# Patient Record
Sex: Male | Born: 1935 | Race: White | Hispanic: No | Marital: Married | Smoking: Never smoker
Health system: Southern US, Community
[De-identification: ages and names within clinical notes are randomized; demographics above are authoritative.]

## PROBLEM LIST (undated history)

## (undated) DIAGNOSIS — I714 Abdominal aortic aneurysm, without rupture, unspecified: Secondary | ICD-10-CM

## (undated) DIAGNOSIS — E119 Type 2 diabetes mellitus without complications: Secondary | ICD-10-CM

## (undated) DIAGNOSIS — I10 Essential (primary) hypertension: Secondary | ICD-10-CM

## (undated) HISTORY — DX: Abdominal aortic aneurysm, without rupture: I71.4

## (undated) HISTORY — DX: Abdominal aortic aneurysm, without rupture, unspecified: I71.40

## (undated) HISTORY — PX: HERNIA REPAIR: SHX51

## (undated) HISTORY — DX: Essential (primary) hypertension: I10

## (undated) HISTORY — DX: Type 2 diabetes mellitus without complications: E11.9

---

## 1998-05-21 ENCOUNTER — Ambulatory Visit (HOSPITAL_BASED_OUTPATIENT_CLINIC_OR_DEPARTMENT_OTHER): Admission: RE | Admit: 1998-05-21 | Discharge: 1998-05-21 | Payer: Self-pay | Admitting: Diagnostic Radiology

## 2002-09-10 ENCOUNTER — Encounter: Payer: Self-pay | Admitting: Oncology

## 2002-09-10 ENCOUNTER — Ambulatory Visit (HOSPITAL_COMMUNITY): Admission: RE | Admit: 2002-09-10 | Discharge: 2002-09-10 | Payer: Self-pay | Admitting: Oncology

## 2002-10-07 ENCOUNTER — Ambulatory Visit: Admission: RE | Admit: 2002-10-07 | Discharge: 2002-10-07 | Payer: Self-pay | Admitting: Oncology

## 2002-12-17 ENCOUNTER — Encounter: Payer: Self-pay | Admitting: Family Medicine

## 2002-12-17 ENCOUNTER — Encounter: Admission: RE | Admit: 2002-12-17 | Discharge: 2002-12-17 | Payer: Self-pay | Admitting: Family Medicine

## 2003-01-20 ENCOUNTER — Inpatient Hospital Stay (HOSPITAL_COMMUNITY): Admission: RE | Admit: 2003-01-20 | Discharge: 2003-01-30 | Payer: Self-pay | Admitting: Orthopedic Surgery

## 2003-01-22 ENCOUNTER — Encounter: Payer: Self-pay | Admitting: Orthopedic Surgery

## 2005-06-27 ENCOUNTER — Encounter (HOSPITAL_COMMUNITY): Admission: RE | Admit: 2005-06-27 | Discharge: 2005-09-25 | Payer: Self-pay | Admitting: Family Medicine

## 2006-08-29 ENCOUNTER — Ambulatory Visit: Payer: Self-pay | Admitting: Gastroenterology

## 2006-08-30 ENCOUNTER — Ambulatory Visit: Payer: Self-pay | Admitting: Gastroenterology

## 2006-10-03 ENCOUNTER — Ambulatory Visit: Payer: Self-pay | Admitting: Gastroenterology

## 2007-10-22 ENCOUNTER — Encounter (HOSPITAL_COMMUNITY): Admission: RE | Admit: 2007-10-22 | Discharge: 2008-01-20 | Payer: Self-pay | Admitting: Family Medicine

## 2010-10-29 NOTE — Discharge Summary (Signed)
NAME:  Ethan Walters, Ethan Walters                         ACCOUNT NO.:  [id number]   MEDICAL RECORD NO.:  [id number]                   PATIENT TYPE:  INP   LOCATION:  5012                                 FACILITY:  [facility]   PHYSICIAN:  John L. Rendall, M.D.               DATE OF BIRTH:  04-18-1934   DATE OF ADMISSION:  01/20/2003  DATE OF DISCHARGE:  01/30/2003                                 DISCHARGE SUMMARY   ADMISSION DIAGNOSES:  1. End-stage osteoarthritis, bone on bone, left knee.  2. Hypertension.  3. Hypercholesterolemia.  4. Erythrocytosis.   DISCHARGE DIAGNOSES:  1. Status post left total knee arthroplasty.  2. Acute blood loss anemia secondary to surgery.  3. Gastrointestinal bleed due to gastritis.  4. Deep vein thrombosis, left peroneal vein.  5. Hypertension.  6. Hypercholesterolemia.  7. Erythrocytosis.   HISTORY OF PRESENT ILLNESS:  Ms. Vanloan is a 77 year old white male with a  history of twisting and dislocating his knee approximately 40 years ago. At  that time, he was treated with immobilization. Since that time, he has had  chronic twisting out of joint sensation and about 20 years ago and locked  out of place. The patient eventually underwent a medial meniscectomy  by Dr.  Fannie Knee. Since that time, he has had chronic nagging, dull aching pain with  significant swelling with the sensation that the knee would dislocated out  of joint at any time. He also has associated swelling of the left knee that  required the use of crutches in terms for him to ambulate. Mechanical  symptoms include popping, clicking, and grinding. Currently, he uses a cane  to ambulate. X-rays of the left knee show bone on bone in the medial  compartment of the left knee.   ALLERGIES:  No known drug allergies.   CURRENT MEDICATIONS:  1. Lisinopril 20 mg p.o. q.d.  2. Lipitor 20 mg p.o. q.d.   SURGICAL PROCEDURE:  On January 20, 2003, the patient was taken to operating  room by Dr. Erasmo Leventhal  assisted by Rexene Edison, P.A.-C. The patient was  placed under general anesthesia, and a left total knee replacement was  performed. Total operative time was approximately 1 hour and 15 minutes. The  patient tolerated the procedure well and returned to recovery in good and  stable condition.   On January 20, 2003, the patient had an esophagogastroduodenoscopy performed  by Dr. Dorena Cookey. Evidence of an active recent upper GI bleed but no source  seen and no active bleeding was noted at the time of the procedure. The  patient tolerated the procedure fairly well, and there were no immediate  complications.   CONSULTANTS:  The following consults were obtained while the patient was  hospitalized:  PT, OT, medicine, gastroenterology, and rehab.   HOSPITAL COURSE:  On postoperative day #2, the patient was found to be  vomiting black emesis with a  recurrent feeling of nausea. He was found to  have acute gastritis and therefore was taken off of his Arixtra.  Subsequently, he developed a DVT in the mid posterior calf involving the  peroneal vein. This is a very short segment involved in the mild calf. He  also developed acute blood loss anemia secondary to anemia and required 2  units of packed red blood cells postoperatively. Left proximal femur  abrasion and left buttocks pressure ulcer also developed. These were treated  by the skin care team.   LABORATORY DATA:  Routine labs on admission:  White blood cell count 7.8,  hemoglobin 17.9, hematocrit 51.8, platelets 244.   Coags on admission:  PT 13.4, INR 1.0, PTT 31.   Routine chemistries on admission:  Sodium 140, potassium 5.1, chloride 106,  bicarb 29, BUN 19, creatinine 1.1, glucose 94.   Routine hepatic enzymes were AST of 24, ALT 26, ALP 88, total bilirubin 0.8.   Urinalysis on admission was negative.   Helicobacter pylori antibody on January 23, 2003 ISR 0.5.   EKG dated January 24, 2003 showed normal sinus rhythm with occasional  PVC.  Heart rate 75 beats per minute. PR interval 148 milliseconds. PRT axis 63, -  25, 37.   EKG dated January 27, 2003 showed normal sinus rhythm with frequent premature  ventricular complexes. Heart rate 74 beats per minute. PR interval 152  milliseconds. PRT axis 63, -17, 55.   Chest x-ray dated January 22, 2003 showed cardiomegaly with central vascular  congestion. Two-view abdomen on January 22, 2003 showed no evidence of bowel  obstruction.   Lower extremity venous Doppler exam showed no evidence of DVT, superficial  thrombosis, or Baker's cyst on January 23, 2003. Lower extremity venous  Doppler exam dated January 25, 2003 showed isolated mid posterior calf DVT  involving the peroneal vein. Short segmented DVT mid to upper calf.   DISCHARGE INSTRUCTIONS:  The patient was to resume medications. Add the  following:  Percocet 5 mg one to two p.o. every four to six hours as needed  for pain.   ACTIVITY:  As tolerated with walker. Home health/PT with Genevieve Norlander.   DIET:  No restrictions.   WOUND CARE:  Daily dressing changes. Keep wound clean and dry. Call Dr.  Sable Feil office if temperature greater than 101.5, chills, swelling, foul-  smelling drainage, or pain not controlled with pain medications.   SPECIAL INSTRUCTIONS:  CPM 0 to 90 degrees six to eight hours a day,  increased by 10 degrees daily.   FOLLOW UP:  The patient is to follow up with Dr. Priscille Kluver in approximately  five days in office. The patient is to call the office at [phone] for  appointment.   Follow up with primary care physician in one to two weeks due to upper GI,  acute blood loss anemia, hypertension, and DVT in left leg.   CONDITION ON DISCHARGE:  The patient was discharged home in good and stable  condition.      Richardean Canal, P.A.                       John L. Priscille Kluver, M.D.    Leanora Cover  D:  02/18/2003  T:  02/18/2003  Job:  [id number]

## 2010-10-29 NOTE — Op Note (Signed)
NAME:  Ethan Walters, Ethan Walters                         ACCOUNT NO.:  [id number]   MEDICAL RECORD NO.:  [id number]                   PATIENT TYPE:  INP   LOCATION:  5012                                 FACILITY:  [facility]   PHYSICIAN:  John C. Madilyn Fireman, M.D.                 DATE OF BIRTH:  24-Nov-1933   DATE OF PROCEDURE:  01/20/2003  DATE OF DISCHARGE:                                 OPERATIVE REPORT   l   PROCEDURE PERFORMED:  Esophagogastroduodenoscopy.   INDICATIONS FOR THE PROCEDURE:  Upper GI bleeding.   PROCEDURE:  The patient was placed in the left lateral decubitus position  and placed on the pulse monitor with continuous low-flow oxygen delivered  via nasal cannula.  He was sedated with 30 mcg of IV fentanyl and 3 mg of IV  Versed.  The Olympus video endoscope was advanced under direct vision into  the oropharynx and esophagus.  The esophagus was straight and of normal  caliber at the squamocolumnar line at 38 cm. There was dark mahogany  material refluxing up to the esophagus, and this was difficult to control  throughout the procedure, even with the head of the bed at maximum  elevation.  There was no visible bleeding from the esophagus, although  inspection was less than optimal due to continued regurgitation of gastric  contents, requiring both endoscopic and external suctioning.  The stomach  was entered, and there was a fairly large body of mahogany-colored fluid  with a lot of particulate matter that made suctioning difficult.  I could  not suction all of it adequately to inspect the entire fundus and dependent  portions of the body.  There were also some black flecks of coffee ground  material scattered on the walls of the stomach.  However, a significant  portion of the body and some of the fundus was inspected, and no bleeding  sources were seen.  The antrum likewise appeared normal.  No ulcer or active  bleeding was seen.  The pylorus was not deformed, and easily allowed  the  passage of the endoscope into the duodenum.  Both the bulb and second  portion were well-inspected with the exception of a patch of erythema in the  bulb of the duodenum.  No abnormalities were found.  No bright red blood or  clot was seen anywhere in the stomach, duodenum, or esophagus.  The scope  was then withdrawn, and the patient returned to the recovery room in stable  condition.  He tolerated the procedure fairly well, and there were no  immediate complications.   IMPRESSION:  Evidence of active recent upper GI bleeding with no source seen  and no active bleeding noted at the time of the procedure.   PLAN:  We will continue to treat with proton pump inhibitor.  Continue NG  suction.  Transfuse as necessary.  Check H. pylori antibody.  Treat for  eradication of Helicobacter if positive.                                               John C. Madilyn Fireman, M.D.    JCH/MEDQ  D:  01/22/2003  T:  01/23/2003  Job:  [id number]

## 2010-10-29 NOTE — H&P (Signed)
NAME:  Ethan Walters, Ethan Walters                         ACCOUNT NO.:  [id number]   MEDICAL RECORD NO.:  [id number]                   PATIENT TYPE:  INP   LOCATION:  NA                                   FACILITY:  [facility]   PHYSICIAN:  John L. Rendall, M.D.               DATE OF BIRTH:  01-Nov-1933   DATE OF ADMISSION:  01/20/2003  DATE OF DISCHARGE:                                HISTORY & PHYSICAL   CHIEF COMPLAINT:  Painful left knee approximately 40 years.   HISTORY OF PRESENT ILLNESS:  The patient is a 77 year old white male with  history of twisting and dislocating his knee approximately 40 years ago. He  was treated with immobilization. He indicates he has had since that time  chronic twisting out of joint sensation, but about 20 years ago, it locked  out of place. He was evaluated by Dr. Fannie Knee who performed a medial  meniscectomy, open. Since that time, the patient has been having chronic  nagging dull aching pain with significant swelling any time he had a  sensation like the knee would dislocated out of joint. He would be able to  self-reduce. He would have chronic swelling for periods of time which he  would have to alter his ambulating status with use of crutches. He does have  chronic mechanical symptoms like popping and clicking and grinding. He is  currently using a cane to ambulate.   X-rays show bone on bone medial compartment, left knee.   ALLERGIES:  No known drug allergies.   CURRENT MEDICATIONS:  1. Lisinopril 20 mg p.o. q.d.  2. Lipitor 20 mg p.o. q.d.   PAST MEDICAL HISTORY:  1. Hypertension followed by Dr. Penni Bombard.  2. Hypercholesterolemia.  3. Erythrocytosis, currently being followed and monitored by Dr. Arline Asp.   PAST SURGICAL HISTORY:  Left knee open meniscectomy by Dr. Fannie Knee in 1984.   The patient denies any complications of the above mentioned surgical  procedure.   SOCIAL HISTORY:  The patient is a healthy-appearing, well-developed, white  male 77 years  of age. He does have a 46 year of a pack and a half smoking  history, but he did stop on May 15 of this year and has not smoked since.  The patient denies any alcohol use. He is married. He does have two grown  children. He currently lives in a two-story house with seven steps to main  entrance with living facilities on the second floor.   FAMILY PHYSICIAN:  Dr. Penni Bombard at [phone].   HEMATOLOGIST:  Dr. Arline Asp.   FAMILY HISTORY:  Mother is deceased from old age. Father is deceased from  complications of the flu and pneumonia. The patient has got two brothers,  oldest brother is in good health; younger brother has had multiple CABG and  recently diagnosed with diabetes with insulin treatment.   REVIEW OF SYSTEMS:  Positive for glasses only, left knee  pain. Other than  that, he is negative for any complaints in the general, sensory,  respiratory, cardiac, GI, GU, musculoskeletal, hematologic, neurological, or  mental status issues not mentioned above.   PHYSICAL EXAMINATION:  VITAL SIGNS:  Height is 5 foot 11, weight 214 pounds.  Pulse is 62 and slightly irregular with palpable premature beats.  Respirations are 16. Blood pressure is 162/78. The patient is afebrile.  HEENT:  Head was normocephalic, atraumatic, nontender maxilla and frontal  sinuses. The patient is wearing glasses. Pupils are equal and round and  reactive and accommodating to light. Extraocular movements intact. Sclerae  is nonicteric. External ears without deformities. Canals patent. TMs pearly  gray intact. Gross hearing is intact. Nasal septum was midline. Oral buccal  mucosa was pink and moist without lesions. Dentation was in fair repair. The  patient was able to swallow without any difficulty.  NECK:  Supple. No palpable lymphadenopathy. Thyroid region was nontender.  The patient had full range of motion of his cervical spine and no tenderness  along the entire spinal column with percussion.  EXTREMITIES:  Upper  extremities were symmetrically size and shaped. He had  full range of motion of his shoulders, elbows, and wrists without any  discomfort. Motor strength was 5/5. Lower extremities:  Right and left hip  had full range of motion with full extension, flexion up to 120 degrees, 20  degrees internal and external rotation without any discomfort or mechanical  symptoms. The left knee had a slight amount of soft tissue swelling about  it. No palpable fusion. No sign of erythema or ecchymosis. He had a midline  curved well healed incision over the joint. He had about 5 degrees short of  full extension, flexion back to about 90 degrees. He had slight medial to  lateral laxity but no instability. He was tender along the medial joint  line, nontender laterally. He significant crepitus on the patella with range  of motion. Calf was nontender. Right knee was without any signs of erythema  or ecchymosis. No palpable effusions. Nontender medial lateral joint line.  He had full extension and flexion back to 115 degrees. No medial, lateral,  or AP instability. Slight crepitus on the patella. Calf was nontender.  Ankles were symmetrical with good dorsi and plantar flexion.  LUNGS:  Clear and equal bilaterally. No wheezes, rales, rhonchi, or rubs  noted.  HEART:  Regular rate and rhythm with an occasional extra palpable beat. No  murmurs, rubs, or gallops  noted.  ABDOMEN:  Soft, nontender. Bowel sounds were normal active throughout. No  hepatosplenomegaly. The CVA region was nontender.  PERIPHERAL VASCULAR:  Carotid pulses were 2+, no bruits. Radial pulses were  2+. Dorsalis pedis and posterior tibial pulses were 1+. He had no lower  extremity edema. He did have some varicosities throughout the lower calves  bilaterally. No pigmentation changes.  NEUROLOGICAL:  The patient was conscious, alert, and appropriate. Held an easy conversation with the examiner. Cranial nerves II-XII were grossly  intact. The  patient was grossly intact to light touch and sensation from  head to toe. Deep tendon reflexes of the upper and lower extremities were  symmetrical right to left. He had no gross neurological defects noted.  BREASTS/RECTAL/GENITOURINARY EXAMS:  Deferred at this time.   IMPRESSION:  1. End-stage osteoarthritis, bone on bone medial joint left knee.  2. Hypertension.  3. Hypercholesterolemia.  4. Erythrocytosis.    PLAN:  The patient has been evaluated by Dr. Penni Bombard and  approved for  surgery with no significant increase in risk. He did have a cardiac stress  test performed which was normal. The patient will undergo all routine labs  and tests prior to having a left total knee arthroplasty by Dr. Priscille Kluver at  [facility] on January 20, 2003.      Jamelle Rushing, P.A.                      John L. Priscille Kluver, M.D.    RWK/MEDQ  D:  01/14/2003  T:  01/14/2003  Job:  [id number]

## 2010-10-29 NOTE — Consult Note (Signed)
NAME:  Ethan Walters, Ethan Walters                         ACCOUNT NO.:  [id number]   MEDICAL RECORD NO.:  [id number]                   PATIENT TYPE:  INP   LOCATION:  5012                                 FACILITY:  [facility]   PHYSICIAN:  Alfonse Spruce, M.D.               DATE OF BIRTH:  December 17, 1933   DATE OF CONSULTATION:  01/22/2003  DATE OF DISCHARGE:                                   CONSULTATION   REASON FOR CONSULTATION:  Sudden change in the leukocytosis as well as  increased BUN and creatinine overnight associated with nausea and vomiting,  recurrent.   HISTORY OF PRESENT ILLNESS:  The patient, how is a 77 year old white male,  admitted to the hospital under care of Dr. Priscille Kluver where he had surgical  intervention for the left knee total knee replacement because of end-stage  osteoarthritis which was done on January 20, 2003.  The patient had a history  of hypertension and hyperlipidemia.  The hypercholesterolemia and  erythrocytosis monitored by Lenard Galloway. Chaney Malling, M.D.  His attending PCP  physician, Rodolph Bong, M.D., [phone].   PAST MEDICAL HISTORY:  Open meniscectomy by Paul Dykes. Fannie Knee, M.D. in 1994.   SOCIAL HISTORY:  He has quit smoking and he had history of 46 years of  smoking, quit 15 years ago. He has no alcohol abuse. He has two children.   FAMILY HISTORY:  Noncontributory.   REVIEW OF SYSTEMS:  He had erythrocytosis treated by Samul Dada,  M.D., who is oncologist.  The patient found that he was vomiting black  hematemesis with recurrence with feeling of nausea continuously being  present in spite of recurrent vomiting.  He had no significant abdominal  pain.  The patient currently on prophylaxis anticoagulant and he had a  recent history of diabetes mellitus.  No history of MI.   CURRENT MEDICATIONS:  1. Lisinopril 20 mg daily.  2. Lipitor 20 mg daily.  3. Oxycodone 10 mg b.i.d.  4. Robaxin 500 mg p.o. q.6h.   PHYSICAL EXAMINATION:  GENERAL: The patient is  conscious, alert, and  oriented.  He has been feeling ill and sick on the stomach mainly, however,  no shortness of breath.  VITAL SIGNS:  He had a low grade temperature with temperature of 99.2, blood  pressure was decreased today to 90/44, however, he had history of  hypertension, oxygen desaturation of 94, and pulse was 91.  His laboratory  each morning indicating white count 14.9 and it was 10.4 yesterday.  His  hemoglobin 9.8 and hematocrit 27.6, and yesterday his hemoglobin was 12.5  and hematocrit 35.7.  His BUN was 25 and creatinine 1.8 on August 10.  However on August 11, today, his BUN went to 47.  Sodium 145, potassium 4.9,  and chloride 102, carbon dioxide 27, and blood sugar was 204.  HEENT:  Normocephalic and atraumatic.  Pupils equal, round, and reactive to  light.  Oropharynx was normal.  He had evidence of hematemesis, black  vomitus.  NECK:  Supple with no JVD, no thyromegaly, and no lymphadenopathy.  CHEST:  Clear to auscultation and percussion and no wheezes or rhonchi.  HEART:  PMI at the fifth intercostal space. Normal S1 and S2.  He had  occasional PVC's.  Asymptomatic.  ABDOMEN:  Only tenderness was deep palpation.  Chest x-ray; cardiomegaly and  rest was in good condition.  However, clinically he is not. He has normal  small bowel.  He has pattern and no free air. However, the patient has been  recurrent vomiting and even in front of me with no comfort.  EXTREMITIES:  He had left knee arthroplasty with 1+ edema, expected after  surgery. The right lower extremity; no tenderness on the calf.  The patient  also is on prophylactic Arixtra subcutaneous 2.5 mg every 24 hours  anticoagulant.   IMPRESSION:  1. Underlying acute gastritis with nausea and vomiting recurrent,     possibility of retro effect of the anticoagulant, however, other etiology     could not be clear.  2. The BUN elevation could be from the blood in the abdomen, underlying     hypotension also  will effect the renal function.  Underlying history of     hypertension currently is hypotensive.  3. Status post left knee arthroplasty.   PLAN:  We will start with NG tube and the gastric contents for hemoccult.  Hemoglobin and hematocrit stat as well as start IV fluids with D5.9 normal  saline 100 mL an hour with the CBG q.6h to be covered with Humulin Regular  Insulin to scale. Start him on Reglan 10 mg IV piggyback q.6h and then again  if still continued nausea and vomiting on a p.r.n. basis.   Further investigation will depend on the patient's debility and if he is  positive for hemoccult, the gastric content for occult blood, probability to  withhold the prophylaxis for the fondaparinux Arixtra.  For prophylaxis  for DVT.                                               Alfonse Spruce, M.D.    Wynn Maudlin  D:  01/22/2003  T:  01/23/2003  Job:  [id number]   cc:   Rodolph Bong, M.D.  [address] [place], [place] [address]  Fax: [phone]   Carlisle Beers. Rendall III, M.D.  [address] [place]  [place] [address]  Fax: [phone]

## 2010-10-29 NOTE — Assessment & Plan Note (Signed)
[facility]                         GASTROENTEROLOGY OFFICE NOTE   FOUNT, BAHE                      MRN:          [id number]  DATE:10/03/2006                            DOB:          07-28-1933    Ethan Walters underwent colonoscopy on August 30, 2006 because of rectal  bleeding and was found to have acute sigmoid diverticulitis.  He was  treated appropriately with metronidazole and Cipro and is completely  asymptomatic at this point.  Endoscopy at the same time showed a large  duodenal ulcer that was felt secondary to NSAID use.  He was taken off  of aspirin and NSAIDs and placed on Prevacid 30 mg a day.  He denies any  upper GI complaints at this time.  His appetite is good and his weight  is stable.  He is followed by Dr. Artis Flock.   Additionally he takes:  1. Lipitor 20 mg daily.  2. Lisinopril/hydrochlorothiazide daily.  3  Prevacid daily.   He weighs 214 pounds and blood pressure is 160/88.  Pulse was 72 and  regular  General physical exam was not repeated.   RECOMMENDATIONS:  1. High fiber diet with daily Benefiber, incresed  p.o. fluids.  2. Patient education movie diverticulitis management.  3. Another month of Prevacid and then I think he can discontinue this      medication if he is not on aspirin or NSAIDs.  His scheduled biopsy      for Helicobacter pylori was negative.  4. Continue medical followup with Dr. Artis Flock.     Vania Rea. Jarold Motto, MD, Caleen Essex, FAGA  Electronically Signed    DRP/MedQ  DD: 10/03/2006  DT: 10/03/2006  Job #: [id number]   cc:   Quita Skye. Artis Flock, M.D.

## 2010-10-29 NOTE — Op Note (Signed)
NAME:  Ethan Walters, Ethan Walters                         ACCOUNT NO.:  [id number]   MEDICAL RECORD NO.:  [id number]                   PATIENT TYPE:  INP   LOCATION:  2550                                 FACILITY:  [facility]   PHYSICIAN:  John L. Rendall III, M.D.           DATE OF BIRTH:  October 09, 1933   DATE OF PROCEDURE:  01/20/2003  DATE OF DISCHARGE:                                 OPERATIVE REPORT   PREOPERATIVE DIAGNOSIS:  End-stage osteoarthritis, left knee.   POSTOPERATIVE DIAGNOSIS:  End-stage osteoarthritis, left knee.   OPERATION/PROCEDURE:  Left LCS total knee replacement.   SURGEON:  John L. Rendall, M.D.   ASSISTANT:  Madilyn Fireman, P.A.-C.   ANESTHESIA:  General.   PATHOLOGY:  The patient is bone against bone medially.  He has unremitting  pain and conservative measures no longer work.   DESCRIPTION OF PROCEDURE:  Under general anesthesia the left leg was  prepared with Betadine and draped as a sterile field with a sterile  tourniquet on the proximal thigh.  A midline incision is made after wrapping  up the leg with an Esmarch and using the tourniquet at 350 mmHg.  Dissection  is carried down through skin and subcutaneous tissue.  Patella is everted.  Femur is sized at a large using the first tibial guide, a proximal tibial  resection is carried out using the first femoral guide, intercondylar drill  hole is placed using the second guide, the interim posterior flare of the  distal femur are resected with a 10 mm flexion gap.  The intramedullary  guide is then used and distal femoral cut is made.  It is remade until  flexion and extension gaps balance which took two more cuts. At this point,  tourniquet spontaneously came down.  The leg was elevated, rewrapped and the  tourniquet reworked.  As it was a sterile tourniquet, it was possible to  just tighten it up and redo it.  The cause of its failure was never  determined.  The case then continued, recessing guide was used and the  spreader was inserted.  Remnants of cruciates and menisci were resected.  The __________ were inserted to give a size #5.  Center peg hole with fins  was applied.  Trial reduction of a #5 tibia, pin bearing and large femur  revealed excellent fit and alignment.  Patella was osteotomized with three  peg holes placed.  Trial reduction of the patella with all the other  components reveal excellent fit, alignment and stability.  Permanent  components were then obtained.  Bony surfaces were prepared with pulse  irrigation.  Permanent components were then cemented in place.  Once cement  hardened, excess was removed and the tourniquet was let down.  Multiple  small vessels were cauterized.  The knee was then closed in layers with #1  Ticron, 0- and 2-0 Vicryl and skin clips.  Total operative time about 1 hour  and 15 minutes.  The patient tolerated the procedure well and returned to  recovery in good condition.                                                John L. Dorothyann Gibbs, M.D.    Renato Gails  D:  01/20/2003  T:  01/20/2003  Job:  [id number]

## 2010-10-29 NOTE — Assessment & Plan Note (Signed)
[facility]                         GASTROENTEROLOGY OFFICE NOTE   NAME:MORRISFrederik, Standley                      MRN:          [id number]  DATE:08/29/2006                            DOB:          01/15/1934    Mr. Amer is a 77 year old white male referred on emergent basis by Dr.  Artis Flock for evaluation of abdominal pain and rectal bleeding.   HISTORY OF PRESENT ILLNESS:  Mr. Haithcock has been in good health except  for hypercholesterolemia and hypertension, until approximately 3 days  ago when he developed crampy, suprapubic pain followed by initially some  clots of blood and associated tenesmus. His stools have gotten lighter  in color but his abdominal pain has continued. He denies nausea, or  vomiting, hematemesis, or any upper GI, or hepatobiliary complaints. He  also denies abuse of NSAIDs but does take the aspirin 81 mg a day for  prophylactic purposes.   The patient has a history of black stools and I previously saw him and  performed endoscopy in November of 2004. At that time it was felt that  he most likely had a healed NSAID induced ulcer. He had a total knee  replacement done in August of 2004 and developed some postop bleeding  and had a negative endoscopy performed by Dr. Dorena Cookey. The patient  never had any specific upper GI complaints that I am aware of, although  his endoscopy report of November 2004 relates that he did have some  epigastric pain and a history of hematemesis.   The patient at that time was empirically placed on Prevacid and did well  and was not seen again until this time. He did have colonoscopy as  screening procedure 10 years ago that was unremarkable, except for  numerous wide mouthed left colon diverticula. At that time he was placed  on Metamucil and a high fiber diet.   Patient denies again the use of NSAIDs or other salicylates.  His  appetite is good and his weight is stable. He denies any palpitations,  or shortness of breath, or chest pain. Saw Dr. Artis Flock in his office today  and had appropriate laboratory test ordered which are not available at  this time. He has had no major change in his diet and denies any  specific food intolerances.   PAST MEDICAL HISTORY:  Otherwise unremarkable except for hypertension,  hypercholesterolemia. He has had a knee replacement done by Dr. Priscille Kluver.  He was seen by Dr. Diona Browner in 2004 and had a normal exercise adenosine  cardiac scan but he did have an inferior apical defect.   MEDICATIONS:  1. Unknown blood pressure pill.  2. Unknown cholesterol medication.  3. Aspirin 81 mg a day.   He has had some edema with neosporin used locally.   FAMILY HISTORY:  Noncontributory.   SOCIAL HISTORY:  He is married and lives with his wife. He has 12th  grade education. He currently is retired. He denies the use of alcohol  or cigarettes.   REVIEW OF SYSTEMS:  Noncontributory without any cardiovascular,  pulmonary, genitourinary, neurologic, orthopedic, or endocrine problems,  or psychiatric difficulties.   PHYSICAL EXAMINATION:  Shows him to be a health-appearing white male in  no acute distress, appearing his stated age. He is 5 feet 11 inches tall  and weighs 211 pounds. Blood pressure is 142/76 and pulse was 72 and  regular. I could not appreciate stigmata of chronic liver disease.  CHEST: Clear and he appeared to be in regular rhythm without murmurs,  gallops, or rubs.  There was no hepatosplenomegaly, abdominal masses, or significant  tenderness. Bowel sounds were normal.  RECTAL: Unremarkable as was his rectal exam. There was hard normal  colored stool in the rectal vault that was guaiac positive.  PERIPHERAL EXTREMITIES: Unremarkable without edema or phlebitis.  MENTAL STATUS: Clear.   ASSESSMENT:  I think Mr. Plantz most likely has a lower gastrointestinal  bleed, perhaps diverticulosis which is resolving at this time. He does  have lower  abdominal suprapubic pain and tenesmus. However, I am  concerned that he may have underlying colon carcinoma. There has been  questions in the past as to possible recurrent NSAID induced ulcer  disease although I think this is unlikely.   RECOMMENDATIONS:  1. We will try to get blood work done this morning at Dr. Blair Heys      office.  2. We will have the patient stay on clear liquids and do colonoscopy      tomorrow after bowel preparation. He may need endoscopic exam      additionally.  3. Discontinue salicylate use.  4. Empirically place him on Prevacid until procedures can be      completed.     Vania Rea. Jarold Motto, MD, Caleen Essex, FAGA  Electronically Signed    DRP/MedQ  DD: 08/29/2006  DT: 08/29/2006  Job #: [id number]   cc:   Quita Skye. Artis Flock, M.D.

## 2011-03-09 LAB — CBC
HCT: 44.2
Hemoglobin: 15.8
MCHC: 35.8
MCV: 100.7 — ABNORMAL HIGH
Platelets: 244
RBC: 4.39
RDW: 12.6
WBC: 6.8

## 2015-06-26 DIAGNOSIS — R69 Illness, unspecified: Secondary | ICD-10-CM | POA: Diagnosis not present

## 2015-07-29 DIAGNOSIS — H34831 Tributary (branch) retinal vein occlusion, right eye, with macular edema: Secondary | ICD-10-CM | POA: Diagnosis not present

## 2015-07-29 DIAGNOSIS — H35351 Cystoid macular degeneration, right eye: Secondary | ICD-10-CM | POA: Diagnosis not present

## 2015-07-29 DIAGNOSIS — H353131 Nonexudative age-related macular degeneration, bilateral, early dry stage: Secondary | ICD-10-CM | POA: Diagnosis not present

## 2015-08-06 DIAGNOSIS — K420 Umbilical hernia with obstruction, without gangrene: Secondary | ICD-10-CM | POA: Diagnosis not present

## 2015-08-06 DIAGNOSIS — R1084 Generalized abdominal pain: Secondary | ICD-10-CM | POA: Diagnosis not present

## 2015-08-06 DIAGNOSIS — R1909 Other intra-abdominal and pelvic swelling, mass and lump: Secondary | ICD-10-CM | POA: Diagnosis not present

## 2015-08-06 DIAGNOSIS — M25552 Pain in left hip: Secondary | ICD-10-CM | POA: Diagnosis not present

## 2015-08-06 DIAGNOSIS — K439 Ventral hernia without obstruction or gangrene: Secondary | ICD-10-CM | POA: Diagnosis not present

## 2015-08-13 DIAGNOSIS — K429 Umbilical hernia without obstruction or gangrene: Secondary | ICD-10-CM | POA: Diagnosis not present

## 2015-08-13 DIAGNOSIS — K4090 Unilateral inguinal hernia, without obstruction or gangrene, not specified as recurrent: Secondary | ICD-10-CM | POA: Insufficient documentation

## 2015-08-13 DIAGNOSIS — M6208 Separation of muscle (nontraumatic), other site: Secondary | ICD-10-CM | POA: Diagnosis not present

## 2015-08-20 DIAGNOSIS — I10 Essential (primary) hypertension: Secondary | ICD-10-CM | POA: Diagnosis not present

## 2015-08-20 DIAGNOSIS — Z01818 Encounter for other preprocedural examination: Secondary | ICD-10-CM | POA: Diagnosis not present

## 2015-08-21 DIAGNOSIS — D176 Benign lipomatous neoplasm of spermatic cord: Secondary | ICD-10-CM | POA: Diagnosis not present

## 2015-08-21 DIAGNOSIS — N189 Chronic kidney disease, unspecified: Secondary | ICD-10-CM | POA: Diagnosis not present

## 2015-08-21 DIAGNOSIS — Z79899 Other long term (current) drug therapy: Secondary | ICD-10-CM | POA: Diagnosis not present

## 2015-08-21 DIAGNOSIS — Z7984 Long term (current) use of oral hypoglycemic drugs: Secondary | ICD-10-CM | POA: Diagnosis not present

## 2015-08-21 DIAGNOSIS — I129 Hypertensive chronic kidney disease with stage 1 through stage 4 chronic kidney disease, or unspecified chronic kidney disease: Secondary | ICD-10-CM | POA: Diagnosis not present

## 2015-08-21 DIAGNOSIS — K4090 Unilateral inguinal hernia, without obstruction or gangrene, not specified as recurrent: Secondary | ICD-10-CM | POA: Diagnosis not present

## 2015-08-21 DIAGNOSIS — E1122 Type 2 diabetes mellitus with diabetic chronic kidney disease: Secondary | ICD-10-CM | POA: Diagnosis not present

## 2015-08-21 DIAGNOSIS — Z7982 Long term (current) use of aspirin: Secondary | ICD-10-CM | POA: Diagnosis not present

## 2015-08-21 DIAGNOSIS — K429 Umbilical hernia without obstruction or gangrene: Secondary | ICD-10-CM | POA: Diagnosis not present

## 2015-08-21 DIAGNOSIS — E119 Type 2 diabetes mellitus without complications: Secondary | ICD-10-CM | POA: Diagnosis not present

## 2015-09-03 DIAGNOSIS — Z09 Encounter for follow-up examination after completed treatment for conditions other than malignant neoplasm: Secondary | ICD-10-CM | POA: Insufficient documentation

## 2015-09-03 DIAGNOSIS — R69 Illness, unspecified: Secondary | ICD-10-CM | POA: Diagnosis not present

## 2015-09-16 DIAGNOSIS — R69 Illness, unspecified: Secondary | ICD-10-CM | POA: Diagnosis not present

## 2015-09-16 DIAGNOSIS — K353 Acute appendicitis with localized peritonitis: Secondary | ICD-10-CM | POA: Diagnosis not present

## 2015-09-16 DIAGNOSIS — E119 Type 2 diabetes mellitus without complications: Secondary | ICD-10-CM | POA: Diagnosis not present

## 2015-09-16 DIAGNOSIS — Z7982 Long term (current) use of aspirin: Secondary | ICD-10-CM | POA: Diagnosis not present

## 2015-09-16 DIAGNOSIS — I739 Peripheral vascular disease, unspecified: Secondary | ICD-10-CM | POA: Diagnosis not present

## 2015-09-16 DIAGNOSIS — M1990 Unspecified osteoarthritis, unspecified site: Secondary | ICD-10-CM | POA: Diagnosis not present

## 2015-09-16 DIAGNOSIS — K3580 Unspecified acute appendicitis: Secondary | ICD-10-CM | POA: Diagnosis not present

## 2015-09-16 DIAGNOSIS — I10 Essential (primary) hypertension: Secondary | ICD-10-CM | POA: Diagnosis not present

## 2015-09-17 DIAGNOSIS — I444 Left anterior fascicular block: Secondary | ICD-10-CM | POA: Diagnosis not present

## 2015-09-17 DIAGNOSIS — K3580 Unspecified acute appendicitis: Secondary | ICD-10-CM | POA: Diagnosis not present

## 2015-09-17 HISTORY — PX: APPENDECTOMY: SHX54

## 2015-10-02 DIAGNOSIS — H34831 Tributary (branch) retinal vein occlusion, right eye, with macular edema: Secondary | ICD-10-CM | POA: Diagnosis not present

## 2015-10-02 DIAGNOSIS — H353131 Nonexudative age-related macular degeneration, bilateral, early dry stage: Secondary | ICD-10-CM | POA: Diagnosis not present

## 2015-10-05 DIAGNOSIS — R05 Cough: Secondary | ICD-10-CM | POA: Diagnosis not present

## 2015-10-05 DIAGNOSIS — J209 Acute bronchitis, unspecified: Secondary | ICD-10-CM | POA: Diagnosis not present

## 2015-10-05 DIAGNOSIS — J22 Unspecified acute lower respiratory infection: Secondary | ICD-10-CM | POA: Diagnosis not present

## 2015-10-08 DIAGNOSIS — Z8719 Personal history of other diseases of the digestive system: Secondary | ICD-10-CM | POA: Insufficient documentation

## 2015-10-08 DIAGNOSIS — I714 Abdominal aortic aneurysm, without rupture, unspecified: Secondary | ICD-10-CM | POA: Insufficient documentation

## 2015-10-08 DIAGNOSIS — Z9889 Other specified postprocedural states: Secondary | ICD-10-CM

## 2015-10-09 ENCOUNTER — Encounter: Payer: Self-pay | Admitting: Vascular Surgery

## 2015-10-15 ENCOUNTER — Encounter: Payer: Self-pay | Admitting: Vascular Surgery

## 2015-10-16 ENCOUNTER — Encounter: Payer: Self-pay | Admitting: Vascular Surgery

## 2015-10-20 ENCOUNTER — Encounter: Payer: Self-pay | Admitting: Vascular Surgery

## 2015-10-23 ENCOUNTER — Encounter: Payer: Self-pay | Admitting: Vascular Surgery

## 2015-10-29 ENCOUNTER — Encounter: Payer: Self-pay | Admitting: Vascular Surgery

## 2015-10-29 ENCOUNTER — Ambulatory Visit (INDEPENDENT_AMBULATORY_CARE_PROVIDER_SITE_OTHER): Payer: Medicare HMO | Admitting: Vascular Surgery

## 2015-10-29 VITALS — BP 139/71 | HR 81 | Temp 97.5°F | Resp 16 | Ht 68.0 in | Wt 198.0 lb

## 2015-10-29 DIAGNOSIS — I714 Abdominal aortic aneurysm, without rupture, unspecified: Secondary | ICD-10-CM

## 2015-10-29 NOTE — Progress Notes (Signed)
Referred by: Lurlean Horns, MD No address on file  Reason for referral: AAA  History of Present Illness  The patient is a 82 y.o. (1933-09-13) male who presents with chief complaint: "aneurysm."  This patient recently had a CT abd/pelvis at outside hospital on 09/17/15.  This CT demonstrated: acute appendicitis and incidentally found AAA, reportedly 5.1 cm x 4.1 cm.  The patient does not have back or abdominal pain.  The patient notes never history of embolic episodes from the AAA.  The patient's risk factors for AAA included: prior smoking.  Protective factors include: DM.  The patient does not smoke cigarettes.  Past Medical History  Diagnosis Date  . Hypertension   . Diabetes mellitus without complication ([place])   . AAA (abdominal aortic aneurysm) [facility])     Past Surgical History  Procedure Laterality Date  . Hernia repair    . Appendectomy  09/17/2015    Social History   Social History  . Marital Status: Married    Spouse Name: N/A  . Number of Children: N/A  . Years of Education: N/A   Occupational History  . Not on file.   Social History Main Topics  . Smoking status: Never Smoker   . Smokeless tobacco: Never Used  . Alcohol Use: No  . Drug Use: No  . Sexual Activity: Not on file   Other Topics Concern  . Not on file   Social History Narrative    History reviewed. No pertinent family history.  Current Outpatient Prescriptions  Medication Sig Dispense Refill  . albuterol (PROAIR HFA) 108 (90 Base) MCG/ACT inhaler     . aspirin EC 81 MG tablet Take by mouth.    . Cholecalciferol (VITAMIN D3) 2000 units capsule Take by mouth.    Marland Kitchen glucose blood (ONE TOUCH ULTRA TEST) test strip     . Liraglutide (VICTOZA) 18 MG/3ML SOPN     . lisinopril-hydrochlorothiazide (PRINZIDE,ZESTORETIC) 20-12.5 MG tablet     . simvastatin (ZOCOR) 40 MG tablet Take 40 mg by mouth.    . vitamin C (ASCORBIC ACID) 500 MG tablet Take 500 mg by mouth.     No current  facility-administered medications for this visit.     Allergies  Allergen Reactions  . Neosporin [Neomycin-Bacitracin Zn-Polymyx] Swelling     REVIEW OF SYSTEMS:  (Positives checked otherwise negative)  CARDIOVASCULAR:   [ ]  chest pain,  [ ]  chest pressure,  [ ]  palpitations,  [ ]  shortness of breath when laying flat,  [ ]  shortness of breath with exertion,   [ ]  pain in feet when walking,  [ ]  pain in feet when laying flat, [ ]  history of blood clot in veins (DVT),  [ ]  history of phlebitis,  [ ]  swelling in legs,  [ ]  varicose veins  PULMONARY:   [ ]  productive cough,  [ ]  asthma,  [ ]  wheezing  NEUROLOGIC:   [ ]  weakness in arms or legs,  [ ]  numbness in arms or legs,  [ ]  difficulty speaking or slurred speech,  [ ]  temporary loss of vision in one eye,  [ ]  dizziness  HEMATOLOGIC:   [ ]  bleeding problems,  [ ]  problems with blood clotting too easily  MUSCULOSKEL:   [ ]  joint pain, [ ]  joint swelling  GASTROINTEST:   [ ]  vomiting blood,  [ ]  blood in stool     GENITOURINARY:   [ ]  burning with urination,  [ ]  blood in  urine  PSYCHIATRIC:   [ ]  history of major depression  INTEGUMENTARY:   [ ]  rashes,  [ ]  ulcers  CONSTITUTIONAL:   [ ]  fever,  [ ]  chills   For VQI Use Only  PRE-ADM LIVING: Home  AMB STATUS: Ambulatory  CAD Sx: None  PRIOR CHF: None  STRESS TEST: [x]  No, [ ]  Normal, [ ]  + ischemia, [ ]  + MI, [ ]  Both   Physical Examination  Filed Vitals:   10/29/15 1341  BP: 139/71  Pulse: 81  Temp: 97.5 F (36.4 C)  Resp: 16  Height: 5\' 8"  (1.727 m)  Weight: 198 lb (89.812 kg)  SpO2: 94%   Body mass index is 30.11 kg/(m^2).  General: A&O x 3, WDWN  Head: /AT  Ear/Nose/Throat: Hearing grossly intact, nares w/o erythema or drainage, oropharynx w/o Erythema/Exudate  Eyes: PERRLA, EOMI  Neck: Supple, no nuchal rigidity, no palpable LAD  Pulmonary: Sym exp, good air movt, CTAB, no rales, rhonchi, &  wheezing  Cardiac: RRR, Nl S1, S2, no Murmurs, rubs or gallops  Vascular: Vessel Right Left  Radial Palpable Palpable  Brachial Palpable Palpable  Carotid Palpable, without bruit Palpable, without bruit  Aorta Not palpable N/A  Femoral Palpable Palpable  Popliteal Not palpable Not palpable  PT Not Palpable Not Palpable  DP Faintly Palpable Faintly Palpable   Gastrointestinal: soft, NTND, no G/R, no HSM, no masses, no CVAT B  Musculoskeletal: M/S 5/5 throughout , Extremities without ischemic changes , BLE large varicose veins  Neurologic: CN 2-12 intact , Pain and light touch intact in extremities , Motor exam as listed above  Psychiatric: Judgment intact, Mood & affect appropriate for pt's clinical situation  Dermatologic: See M/S exam for extremity exam, no rashes otherwise noted  Lymph : No Cervical, Axillary, or Inguinal lymphadenopathy   Outside Studies/Documentation 10 pages of outside documents were reviewed including: outside hospital record from recent appy.   Medical Decision Making  The patient is a 82 y.o. male who presents with: moderate sized AAA.   Unfortunately, don't have the study to review so I can't determine if the AAA measurements are valid.  Based on this patient's exam and diagnostic studies, he needs repeat AAA duplex in 6 months.  The threshold for repair is AAA size > 5.5 cm, growth > 1 cm/yr, and symptomatic status.  I emphasized the importance of maximal medical management including strict control of blood pressure, blood glucose, and lipid levels, antiplatelet agents, obtaining regular exercise, and cessation of smoking.    Thank you for allowing [place] to participate in this patient's care.   Adele Barthel, MD [facility] Office: [phone] Pager: [phone]  10/29/2015, 2:15 PM

## 2015-11-05 DIAGNOSIS — Z87898 Personal history of other specified conditions: Secondary | ICD-10-CM | POA: Diagnosis not present

## 2015-11-05 DIAGNOSIS — M109 Gout, unspecified: Secondary | ICD-10-CM | POA: Diagnosis not present

## 2015-11-05 DIAGNOSIS — E1122 Type 2 diabetes mellitus with diabetic chronic kidney disease: Secondary | ICD-10-CM | POA: Diagnosis not present

## 2015-11-05 DIAGNOSIS — Z794 Long term (current) use of insulin: Secondary | ICD-10-CM | POA: Diagnosis not present

## 2015-11-05 DIAGNOSIS — N183 Chronic kidney disease, stage 3 (moderate): Secondary | ICD-10-CM | POA: Diagnosis not present

## 2015-11-05 DIAGNOSIS — I10 Essential (primary) hypertension: Secondary | ICD-10-CM | POA: Diagnosis not present

## 2015-11-05 DIAGNOSIS — E1165 Type 2 diabetes mellitus with hyperglycemia: Secondary | ICD-10-CM | POA: Diagnosis not present

## 2015-11-05 DIAGNOSIS — I1310 Hypertensive heart and chronic kidney disease without heart failure, with stage 1 through stage 4 chronic kidney disease, or unspecified chronic kidney disease: Secondary | ICD-10-CM | POA: Diagnosis not present

## 2015-11-05 DIAGNOSIS — E785 Hyperlipidemia, unspecified: Secondary | ICD-10-CM | POA: Diagnosis not present

## 2015-11-10 DIAGNOSIS — H35351 Cystoid macular degeneration, right eye: Secondary | ICD-10-CM | POA: Diagnosis not present

## 2015-11-10 DIAGNOSIS — H34831 Tributary (branch) retinal vein occlusion, right eye, with macular edema: Secondary | ICD-10-CM | POA: Diagnosis not present

## 2015-11-30 DIAGNOSIS — Z0000 Encounter for general adult medical examination without abnormal findings: Secondary | ICD-10-CM | POA: Diagnosis not present

## 2015-11-30 DIAGNOSIS — I10 Essential (primary) hypertension: Secondary | ICD-10-CM | POA: Diagnosis not present

## 2015-11-30 DIAGNOSIS — E119 Type 2 diabetes mellitus without complications: Secondary | ICD-10-CM | POA: Diagnosis not present

## 2015-12-08 DIAGNOSIS — I1310 Hypertensive heart and chronic kidney disease without heart failure, with stage 1 through stage 4 chronic kidney disease, or unspecified chronic kidney disease: Secondary | ICD-10-CM | POA: Diagnosis not present

## 2015-12-08 DIAGNOSIS — M109 Gout, unspecified: Secondary | ICD-10-CM | POA: Diagnosis not present

## 2015-12-08 DIAGNOSIS — M79671 Pain in right foot: Secondary | ICD-10-CM | POA: Diagnosis not present

## 2016-01-05 DIAGNOSIS — N183 Chronic kidney disease, stage 3 (moderate): Secondary | ICD-10-CM | POA: Diagnosis not present

## 2016-01-05 DIAGNOSIS — E1122 Type 2 diabetes mellitus with diabetic chronic kidney disease: Secondary | ICD-10-CM | POA: Diagnosis not present

## 2016-01-05 DIAGNOSIS — R69 Illness, unspecified: Secondary | ICD-10-CM | POA: Diagnosis not present

## 2016-01-05 DIAGNOSIS — Z794 Long term (current) use of insulin: Secondary | ICD-10-CM | POA: Diagnosis not present

## 2016-01-05 DIAGNOSIS — I1310 Hypertensive heart and chronic kidney disease without heart failure, with stage 1 through stage 4 chronic kidney disease, or unspecified chronic kidney disease: Secondary | ICD-10-CM | POA: Diagnosis not present

## 2016-01-06 DIAGNOSIS — H34831 Tributary (branch) retinal vein occlusion, right eye, with macular edema: Secondary | ICD-10-CM | POA: Diagnosis not present

## 2016-01-12 ENCOUNTER — Other Ambulatory Visit: Payer: Self-pay | Admitting: Vascular Surgery

## 2016-01-12 DIAGNOSIS — I714 Abdominal aortic aneurysm, without rupture, unspecified: Secondary | ICD-10-CM

## 2016-02-02 DIAGNOSIS — N183 Chronic kidney disease, stage 3 (moderate): Secondary | ICD-10-CM | POA: Diagnosis not present

## 2016-02-02 DIAGNOSIS — Z79899 Other long term (current) drug therapy: Secondary | ICD-10-CM | POA: Diagnosis not present

## 2016-02-02 DIAGNOSIS — I1310 Hypertensive heart and chronic kidney disease without heart failure, with stage 1 through stage 4 chronic kidney disease, or unspecified chronic kidney disease: Secondary | ICD-10-CM | POA: Diagnosis not present

## 2016-03-03 DIAGNOSIS — K5733 Diverticulitis of large intestine without perforation or abscess with bleeding: Secondary | ICD-10-CM | POA: Diagnosis not present

## 2016-03-03 DIAGNOSIS — I951 Orthostatic hypotension: Secondary | ICD-10-CM | POA: Diagnosis not present

## 2016-03-03 DIAGNOSIS — Z7982 Long term (current) use of aspirin: Secondary | ICD-10-CM | POA: Diagnosis not present

## 2016-03-03 DIAGNOSIS — K922 Gastrointestinal hemorrhage, unspecified: Secondary | ICD-10-CM | POA: Diagnosis not present

## 2016-03-03 DIAGNOSIS — I10 Essential (primary) hypertension: Secondary | ICD-10-CM | POA: Diagnosis not present

## 2016-03-03 DIAGNOSIS — D62 Acute posthemorrhagic anemia: Secondary | ICD-10-CM | POA: Diagnosis not present

## 2016-03-03 DIAGNOSIS — I959 Hypotension, unspecified: Secondary | ICD-10-CM | POA: Diagnosis not present

## 2016-03-03 DIAGNOSIS — Z9049 Acquired absence of other specified parts of digestive tract: Secondary | ICD-10-CM | POA: Diagnosis not present

## 2016-03-03 DIAGNOSIS — E119 Type 2 diabetes mellitus without complications: Secondary | ICD-10-CM | POA: Diagnosis not present

## 2016-03-03 DIAGNOSIS — Z87891 Personal history of nicotine dependence: Secondary | ICD-10-CM | POA: Diagnosis not present

## 2016-03-03 DIAGNOSIS — E785 Hyperlipidemia, unspecified: Secondary | ICD-10-CM | POA: Diagnosis not present

## 2016-03-03 DIAGNOSIS — R42 Dizziness and giddiness: Secondary | ICD-10-CM | POA: Diagnosis not present

## 2016-03-03 DIAGNOSIS — Z888 Allergy status to other drugs, medicaments and biological substances status: Secondary | ICD-10-CM | POA: Diagnosis not present

## 2016-03-03 DIAGNOSIS — K625 Hemorrhage of anus and rectum: Secondary | ICD-10-CM | POA: Diagnosis not present

## 2016-03-08 DIAGNOSIS — N183 Chronic kidney disease, stage 3 (moderate): Secondary | ICD-10-CM | POA: Diagnosis not present

## 2016-03-08 DIAGNOSIS — E1122 Type 2 diabetes mellitus with diabetic chronic kidney disease: Secondary | ICD-10-CM | POA: Diagnosis not present

## 2016-03-08 DIAGNOSIS — Z23 Encounter for immunization: Secondary | ICD-10-CM | POA: Diagnosis not present

## 2016-03-08 DIAGNOSIS — K625 Hemorrhage of anus and rectum: Secondary | ICD-10-CM | POA: Diagnosis not present

## 2016-03-08 DIAGNOSIS — E1165 Type 2 diabetes mellitus with hyperglycemia: Secondary | ICD-10-CM | POA: Diagnosis not present

## 2016-03-08 DIAGNOSIS — Z794 Long term (current) use of insulin: Secondary | ICD-10-CM | POA: Diagnosis not present

## 2016-03-08 DIAGNOSIS — I714 Abdominal aortic aneurysm, without rupture: Secondary | ICD-10-CM | POA: Diagnosis not present

## 2016-03-08 DIAGNOSIS — K5792 Diverticulitis of intestine, part unspecified, without perforation or abscess without bleeding: Secondary | ICD-10-CM | POA: Diagnosis not present

## 2016-03-08 DIAGNOSIS — Z1212 Encounter for screening for malignant neoplasm of rectum: Secondary | ICD-10-CM | POA: Diagnosis not present

## 2016-03-10 DIAGNOSIS — R69 Illness, unspecified: Secondary | ICD-10-CM | POA: Diagnosis not present

## 2016-03-11 DIAGNOSIS — Z125 Encounter for screening for malignant neoplasm of prostate: Secondary | ICD-10-CM | POA: Diagnosis not present

## 2016-03-11 DIAGNOSIS — Z794 Long term (current) use of insulin: Secondary | ICD-10-CM | POA: Diagnosis not present

## 2016-03-11 DIAGNOSIS — M109 Gout, unspecified: Secondary | ICD-10-CM | POA: Diagnosis not present

## 2016-03-11 DIAGNOSIS — E669 Obesity, unspecified: Secondary | ICD-10-CM | POA: Diagnosis not present

## 2016-03-11 DIAGNOSIS — E785 Hyperlipidemia, unspecified: Secondary | ICD-10-CM | POA: Diagnosis not present

## 2016-03-11 DIAGNOSIS — Z1389 Encounter for screening for other disorder: Secondary | ICD-10-CM | POA: Diagnosis not present

## 2016-03-11 DIAGNOSIS — N183 Chronic kidney disease, stage 3 (moderate): Secondary | ICD-10-CM | POA: Diagnosis not present

## 2016-03-11 DIAGNOSIS — Z9181 History of falling: Secondary | ICD-10-CM | POA: Diagnosis not present

## 2016-03-11 DIAGNOSIS — E1122 Type 2 diabetes mellitus with diabetic chronic kidney disease: Secondary | ICD-10-CM | POA: Diagnosis not present

## 2016-03-11 DIAGNOSIS — I1310 Hypertensive heart and chronic kidney disease without heart failure, with stage 1 through stage 4 chronic kidney disease, or unspecified chronic kidney disease: Secondary | ICD-10-CM | POA: Diagnosis not present

## 2016-03-11 DIAGNOSIS — E1165 Type 2 diabetes mellitus with hyperglycemia: Secondary | ICD-10-CM | POA: Diagnosis not present

## 2016-03-11 DIAGNOSIS — Z0000 Encounter for general adult medical examination without abnormal findings: Secondary | ICD-10-CM | POA: Diagnosis not present

## 2016-03-14 DIAGNOSIS — K921 Melena: Secondary | ICD-10-CM | POA: Diagnosis not present

## 2016-03-14 DIAGNOSIS — K5730 Diverticulosis of large intestine without perforation or abscess without bleeding: Secondary | ICD-10-CM | POA: Diagnosis not present

## 2016-03-15 DIAGNOSIS — H34831 Tributary (branch) retinal vein occlusion, right eye, with macular edema: Secondary | ICD-10-CM | POA: Diagnosis not present

## 2016-03-24 DIAGNOSIS — Z79899 Other long term (current) drug therapy: Secondary | ICD-10-CM | POA: Diagnosis not present

## 2016-03-24 DIAGNOSIS — E119 Type 2 diabetes mellitus without complications: Secondary | ICD-10-CM | POA: Diagnosis not present

## 2016-03-24 DIAGNOSIS — K5730 Diverticulosis of large intestine without perforation or abscess without bleeding: Secondary | ICD-10-CM | POA: Diagnosis not present

## 2016-03-24 DIAGNOSIS — K6289 Other specified diseases of anus and rectum: Secondary | ICD-10-CM | POA: Diagnosis not present

## 2016-03-24 DIAGNOSIS — I10 Essential (primary) hypertension: Secondary | ICD-10-CM | POA: Diagnosis not present

## 2016-03-24 DIAGNOSIS — Z7982 Long term (current) use of aspirin: Secondary | ICD-10-CM | POA: Diagnosis not present

## 2016-03-24 DIAGNOSIS — K921 Melena: Secondary | ICD-10-CM | POA: Diagnosis not present

## 2016-03-24 DIAGNOSIS — Z87891 Personal history of nicotine dependence: Secondary | ICD-10-CM | POA: Diagnosis not present

## 2016-03-24 DIAGNOSIS — Z1211 Encounter for screening for malignant neoplasm of colon: Secondary | ICD-10-CM | POA: Diagnosis not present

## 2016-03-24 DIAGNOSIS — K5520 Angiodysplasia of colon without hemorrhage: Secondary | ICD-10-CM | POA: Diagnosis not present

## 2016-03-28 DIAGNOSIS — M109 Gout, unspecified: Secondary | ICD-10-CM | POA: Diagnosis not present

## 2016-03-28 DIAGNOSIS — E1165 Type 2 diabetes mellitus with hyperglycemia: Secondary | ICD-10-CM | POA: Diagnosis not present

## 2016-03-28 DIAGNOSIS — N183 Chronic kidney disease, stage 3 (moderate): Secondary | ICD-10-CM | POA: Diagnosis not present

## 2016-03-28 DIAGNOSIS — I1310 Hypertensive heart and chronic kidney disease without heart failure, with stage 1 through stage 4 chronic kidney disease, or unspecified chronic kidney disease: Secondary | ICD-10-CM | POA: Diagnosis not present

## 2016-03-28 DIAGNOSIS — Z794 Long term (current) use of insulin: Secondary | ICD-10-CM | POA: Diagnosis not present

## 2016-03-28 DIAGNOSIS — E1122 Type 2 diabetes mellitus with diabetic chronic kidney disease: Secondary | ICD-10-CM | POA: Diagnosis not present

## 2016-03-28 DIAGNOSIS — Z87898 Personal history of other specified conditions: Secondary | ICD-10-CM | POA: Diagnosis not present

## 2016-03-28 DIAGNOSIS — E785 Hyperlipidemia, unspecified: Secondary | ICD-10-CM | POA: Diagnosis not present

## 2016-04-26 DIAGNOSIS — D649 Anemia, unspecified: Secondary | ICD-10-CM | POA: Diagnosis not present

## 2016-04-26 DIAGNOSIS — R69 Illness, unspecified: Secondary | ICD-10-CM | POA: Diagnosis not present

## 2016-04-28 ENCOUNTER — Encounter: Payer: Self-pay | Admitting: Vascular Surgery

## 2016-04-28 NOTE — Progress Notes (Signed)
Established Abdominal Aortic Aneurysm  History of Present Illness  The patient is a 82 y.o. ([birth year]) male who presents with chief complaint: follow up for AAA.  Previous studies demonstrate an AAA, measuring 5.1 cm x 4.1 cm.  The patient does not have back or abdominal pain.  The patient is a social smoker but has mostly quit.  The patient's PMH, PSH, SH, and FamHx are unchanged from 10/29/15.  Current Outpatient Prescriptions  Medication Sig Dispense Refill  . albuterol (PROAIR HFA) 108 (90 Base) MCG/ACT inhaler     . aspirin EC 81 MG tablet Take by mouth.    . Cholecalciferol (VITAMIN D3) 2000 units capsule Take by mouth.    Marland Kitchen glucose blood (ONE TOUCH ULTRA TEST) test strip     . Liraglutide (VICTOZA) 18 MG/3ML SOPN     . lisinopril-hydrochlorothiazide (PRINZIDE,ZESTORETIC) 20-12.5 MG tablet     . simvastatin (ZOCOR) 40 MG tablet Take 40 mg by mouth.    . vitamin C (ASCORBIC ACID) 500 MG tablet Take 500 mg by mouth.     No current facility-administered medications for this visit.     Allergies  Allergen Reactions  . Neosporin [Neomycin-Bacitracin Zn-Polymyx] Swelling    On ROS today: no thromboembolic sx, no abd or back pain   Physical Examination  Vitals:   04/29/16 0909  BP: (!) 154/78  Pulse: 65  Resp: 18  Temp: 97 F (36.1 C)  TempSrc: Oral  SpO2: 95%  Weight: 199 lb (90.3 kg)  Height: 5\' 9"  (1.753 m)   Body mass index is 29.39 kg/m.  General: A&O x 3, WDWN  Pulmonary: Sym exp, good air movt, CTAB, no rales, rhonchi, & wheezing  Cardiac: RRR, Nl S1, S2, no Murmurs, rubs or gallops  Vascular: Vessel Right Left  Radial Palpable Palpable  Brachial Palpable Palpable  Carotid Palpable, without bruit Palpable, without bruit  Aorta Not palpable N/A  Femoral Palpable Palpable  Popliteal Not palpable Not palpable  PT Not Palpable Not Palpable  DP Faintly Palpable Faintly Palpable   Gastrointestinal: soft, NTND, no G/R, no HSM, no masses, no CVAT B,  +pannus without palpable AAA  Musculoskeletal: M/S 5/5 throughout , Extremities without ischemic changes   Neurologic:  Pain and light touch intact in extremities , Motor exam as listed above   Non-Invasive Vascular Imaging  AAA Duplex (04/28/2016)  4.86 cm x 4.52 cm  Mid-segment stenosis (1.33 cm)  Previous size: 5.1 cm x 4.1 cm (Date: 09/17/15)   Medical Decision Making  Ethan Walters is a 82 y.o. (01-18-1934) male who presents with: asymptomatic small AAA with stable size.   Unclear if the stenosis in the infrarenal aorta is a segment of relatively normal abodminal wall (1.33 cm) vs stenotic segment due to intraluminal thrombus.  Don't see an advantage currently to obtaining a CTA.  Once pt's AAA expands to >5.0 cm, will obtain CTA abd/pelvis as anatomic study anyway to evaluate for EVAR candidancy.  Based on this patient's exam and diagnostic studies, he needs q6 month duplex.  The threshold for repair is AAA size > 5.5 cm, growth > 1 cm/yr, and symptomatic status.  I emphasized the importance of maximal medical management including strict control of blood pressure, blood glucose, and lipid levels, antiplatelet agents, obtaining regular exercise, and cessation of smoking.    Thank you for allowing [place] to participate in this patient's care.   Adele Barthel, MD, FACS [facility] [place] Office: [phone] Pager: [phone]

## 2016-04-29 ENCOUNTER — Ambulatory Visit (INDEPENDENT_AMBULATORY_CARE_PROVIDER_SITE_OTHER): Payer: Medicare HMO | Admitting: Vascular Surgery

## 2016-04-29 ENCOUNTER — Other Ambulatory Visit: Payer: Self-pay | Admitting: *Deleted

## 2016-04-29 ENCOUNTER — Encounter: Payer: Self-pay | Admitting: Vascular Surgery

## 2016-04-29 ENCOUNTER — Ambulatory Visit (HOSPITAL_COMMUNITY)
Admission: RE | Admit: 2016-04-29 | Discharge: 2016-04-29 | Disposition: A | Discharge status: Home / Self Care | Payer: Medicare HMO | Source: Ambulatory Visit | Attending: Vascular Surgery | Admitting: Vascular Surgery

## 2016-04-29 VITALS — BP 154/78 | HR 65 | Temp 97.0°F | Resp 18 | Ht 69.0 in | Wt 199.0 lb

## 2016-04-29 DIAGNOSIS — I714 Abdominal aortic aneurysm, without rupture, unspecified: Secondary | ICD-10-CM

## 2016-05-09 DIAGNOSIS — J209 Acute bronchitis, unspecified: Secondary | ICD-10-CM | POA: Diagnosis not present

## 2016-05-09 DIAGNOSIS — M109 Gout, unspecified: Secondary | ICD-10-CM | POA: Diagnosis not present

## 2016-05-09 DIAGNOSIS — R05 Cough: Secondary | ICD-10-CM | POA: Diagnosis not present

## 2016-07-25 DIAGNOSIS — D62 Acute posthemorrhagic anemia: Secondary | ICD-10-CM | POA: Diagnosis not present

## 2016-07-25 DIAGNOSIS — N189 Chronic kidney disease, unspecified: Secondary | ICD-10-CM

## 2016-07-25 DIAGNOSIS — I714 Abdominal aortic aneurysm, without rupture: Secondary | ICD-10-CM | POA: Diagnosis not present

## 2016-07-25 DIAGNOSIS — I10 Essential (primary) hypertension: Secondary | ICD-10-CM

## 2016-07-25 DIAGNOSIS — K921 Melena: Secondary | ICD-10-CM | POA: Diagnosis not present

## 2016-07-25 DIAGNOSIS — Z8719 Personal history of other diseases of the digestive system: Secondary | ICD-10-CM | POA: Diagnosis not present

## 2016-07-26 DIAGNOSIS — Z8719 Personal history of other diseases of the digestive system: Secondary | ICD-10-CM | POA: Diagnosis not present

## 2016-07-26 DIAGNOSIS — I714 Abdominal aortic aneurysm, without rupture: Secondary | ICD-10-CM | POA: Diagnosis not present

## 2016-07-26 DIAGNOSIS — D62 Acute posthemorrhagic anemia: Secondary | ICD-10-CM | POA: Diagnosis not present

## 2016-07-26 DIAGNOSIS — K921 Melena: Secondary | ICD-10-CM | POA: Diagnosis not present

## 2016-07-27 DIAGNOSIS — D62 Acute posthemorrhagic anemia: Secondary | ICD-10-CM | POA: Diagnosis not present

## 2016-07-27 DIAGNOSIS — K921 Melena: Secondary | ICD-10-CM | POA: Diagnosis not present

## 2016-07-27 DIAGNOSIS — Z8719 Personal history of other diseases of the digestive system: Secondary | ICD-10-CM | POA: Diagnosis not present

## 2016-07-27 DIAGNOSIS — I714 Abdominal aortic aneurysm, without rupture: Secondary | ICD-10-CM | POA: Diagnosis not present

## 2016-10-24 ENCOUNTER — Encounter: Payer: Self-pay | Admitting: Vascular Surgery

## 2016-10-24 NOTE — Progress Notes (Signed)
Established Abdominal Aortic Aneurysm   History of Present Illness   The patient is a 83 y.o. (1934/01/01) male who presents with chief complaint: follow up for AAA.  Previous studies demonstrate an AAA, measuring 4.9 cm x 4.5 cm.  The patient does not have back or abdominal pain.  The patient is not a smoker.  The patient's PMH, PSH, SH, and FamHx are unchanged from 04/29/16.  Current Outpatient Prescriptions  Medication Sig Dispense Refill  . albuterol (PROAIR HFA) 108 (90 Base) MCG/ACT inhaler     . aspirin EC 81 MG tablet Take by mouth.    . Cholecalciferol (VITAMIN D3) 2000 units capsule Take by mouth.    . colchicine 0.6 MG tablet     . glucose blood (ONE TOUCH ULTRA TEST) test strip     . Liraglutide (VICTOZA) 18 MG/3ML SOPN     . lisinopril-hydrochlorothiazide (PRINZIDE,ZESTORETIC) 20-12.5 MG tablet     . simvastatin (ZOCOR) 40 MG tablet Take 40 mg by mouth.    . vitamin C (ASCORBIC ACID) 500 MG tablet Take 500 mg by mouth.     No current facility-administered medications for this visit.     Allergies  Allergen Reactions  . Neosporin [Neomycin-Bacitracin Zn-Polymyx] Swelling    On ROS today: no back or abdominal, increasing exercise tolerance   Physical Examination   Vitals:   10/28/16 0916 10/28/16 0917  BP: (!) 154/86 (!) 158/91  Pulse: 60   Resp: 20   Temp: 97.8 F (36.6 C)   TempSrc: Oral   SpO2: 90%   Weight: 202 lb 9.6 oz (91.9 kg)   Height: 5\' 9"  (1.753 m)     Body mass index is 29.92 kg/m.  General Alert, O x 3, WD, NAD  Pulmonary Sym exp, good B air movt, CTA B  Cardiac RRR, Nl S1, S2, no Murmurs, No rubs, No S3,S4  Vascular Vessel Right Left  Radial Palpable Palpable  Brachial Palpable Palpable  Carotid Palpable, No Bruit Palpable, No Bruit  Aorta Not palpable N/A  Femoral Palpable Palpable  Popliteal Not palpable Not palpable  PT Not palpable Not palpable  DP Faintly palpable Faintly palpable    Gastrointestinal soft,  non-distended, non-tender to palpation, No guarding or rebound, no HSM, no masses, no CVAT B, No palpable prominent aortic pulse,    Musculoskeletal M/S 5/5 throughout  , Extremities without ischemic changes  , No edema present, Varicosities present: L>R, Lipodermatosclerosis present: B shin  Neurologic Pain and light touch intact in extremities , Motor exam as listed above    Non-Invasive Vascular Imaging   AAA Duplex (10/28/2016)  4.86 cm x 4.52 cm (04/28/16)  Current size:  5.1 cm x 4.5 cm   R CIA: 1.4 cm x 1.3 cm  L CIA: 1.6 cm x 1.7 cm   Medical Decision Making   Ethan Walters is a 83 y.o. (08-14-1933) male who presents with: asymptomatic AAA with slightly increasing size.   Based on this patient's exam and diagnostic studies, he needs q6 month AAA duplex.  The threshold for repair is AAA size > 5.5 cm, growth > 1 cm/yr, and symptomatic status.  I emphasized the importance of maximal medical management including strict control of blood pressure, blood glucose, and lipid levels, antiplatelet agents, obtaining regular exercise, and cessation of smoking.    Thank you for allowing [place] to participate in this patient's care.   Adele Barthel, MD, FACS [facility] [place] Office: [phone] Pager: [phone]

## 2016-10-28 ENCOUNTER — Ambulatory Visit (INDEPENDENT_AMBULATORY_CARE_PROVIDER_SITE_OTHER): Payer: Medicare HMO | Admitting: Vascular Surgery

## 2016-10-28 ENCOUNTER — Encounter: Payer: Self-pay | Admitting: Vascular Surgery

## 2016-10-28 ENCOUNTER — Ambulatory Visit (HOSPITAL_COMMUNITY)
Admission: RE | Admit: 2016-10-28 | Discharge: 2016-10-28 | Disposition: A | Discharge status: Home / Self Care | Payer: Medicare HMO | Source: Ambulatory Visit | Attending: Vascular Surgery | Admitting: Vascular Surgery

## 2016-10-28 VITALS — BP 158/91 | HR 60 | Temp 97.8°F | Resp 20 | Ht 69.0 in | Wt 202.6 lb

## 2016-10-28 DIAGNOSIS — I714 Abdominal aortic aneurysm, without rupture, unspecified: Secondary | ICD-10-CM

## 2016-11-02 NOTE — Addendum Note (Signed)
Addended by: Lianne Cure A on: 11/02/2016 01:11 PM   Modules accepted: Orders

## 2017-02-21 DIAGNOSIS — H348312 Tributary (branch) retinal vein occlusion, right eye, stable: Secondary | ICD-10-CM | POA: Diagnosis not present

## 2017-02-21 DIAGNOSIS — H401111 Primary open-angle glaucoma, right eye, mild stage: Secondary | ICD-10-CM | POA: Diagnosis not present

## 2017-02-21 DIAGNOSIS — H353131 Nonexudative age-related macular degeneration, bilateral, early dry stage: Secondary | ICD-10-CM | POA: Diagnosis not present

## 2017-02-21 DIAGNOSIS — H35351 Cystoid macular degeneration, right eye: Secondary | ICD-10-CM | POA: Diagnosis not present

## 2017-03-13 DIAGNOSIS — Z125 Encounter for screening for malignant neoplasm of prostate: Secondary | ICD-10-CM | POA: Diagnosis not present

## 2017-03-13 DIAGNOSIS — Z1389 Encounter for screening for other disorder: Secondary | ICD-10-CM | POA: Diagnosis not present

## 2017-03-13 DIAGNOSIS — E785 Hyperlipidemia, unspecified: Secondary | ICD-10-CM | POA: Diagnosis not present

## 2017-03-13 DIAGNOSIS — E1165 Type 2 diabetes mellitus with hyperglycemia: Secondary | ICD-10-CM | POA: Diagnosis not present

## 2017-03-13 DIAGNOSIS — E1122 Type 2 diabetes mellitus with diabetic chronic kidney disease: Secondary | ICD-10-CM | POA: Diagnosis not present

## 2017-03-13 DIAGNOSIS — Z23 Encounter for immunization: Secondary | ICD-10-CM | POA: Diagnosis not present

## 2017-03-13 DIAGNOSIS — Z9181 History of falling: Secondary | ICD-10-CM | POA: Diagnosis not present

## 2017-03-13 DIAGNOSIS — E669 Obesity, unspecified: Secondary | ICD-10-CM | POA: Diagnosis not present

## 2017-03-13 DIAGNOSIS — Z1211 Encounter for screening for malignant neoplasm of colon: Secondary | ICD-10-CM | POA: Diagnosis not present

## 2017-03-13 DIAGNOSIS — Z0000 Encounter for general adult medical examination without abnormal findings: Secondary | ICD-10-CM | POA: Diagnosis not present

## 2017-04-13 DIAGNOSIS — Z794 Long term (current) use of insulin: Secondary | ICD-10-CM | POA: Diagnosis not present

## 2017-04-13 DIAGNOSIS — R197 Diarrhea, unspecified: Secondary | ICD-10-CM | POA: Diagnosis not present

## 2017-04-13 DIAGNOSIS — N183 Chronic kidney disease, stage 3 (moderate): Secondary | ICD-10-CM | POA: Diagnosis not present

## 2017-04-13 DIAGNOSIS — Z79899 Other long term (current) drug therapy: Secondary | ICD-10-CM | POA: Diagnosis not present

## 2017-04-13 DIAGNOSIS — J449 Chronic obstructive pulmonary disease, unspecified: Secondary | ICD-10-CM | POA: Diagnosis not present

## 2017-04-13 DIAGNOSIS — E1122 Type 2 diabetes mellitus with diabetic chronic kidney disease: Secondary | ICD-10-CM | POA: Diagnosis not present

## 2017-04-13 DIAGNOSIS — I1310 Hypertensive heart and chronic kidney disease without heart failure, with stage 1 through stage 4 chronic kidney disease, or unspecified chronic kidney disease: Secondary | ICD-10-CM | POA: Diagnosis not present

## 2017-04-13 DIAGNOSIS — E1165 Type 2 diabetes mellitus with hyperglycemia: Secondary | ICD-10-CM | POA: Diagnosis not present

## 2017-04-13 DIAGNOSIS — E785 Hyperlipidemia, unspecified: Secondary | ICD-10-CM | POA: Diagnosis not present

## 2017-04-18 DIAGNOSIS — R197 Diarrhea, unspecified: Secondary | ICD-10-CM | POA: Diagnosis not present

## 2017-04-26 DIAGNOSIS — H47233 Glaucomatous optic atrophy, bilateral: Secondary | ICD-10-CM | POA: Diagnosis not present

## 2017-04-26 DIAGNOSIS — H04123 Dry eye syndrome of bilateral lacrimal glands: Secondary | ICD-10-CM | POA: Diagnosis not present

## 2017-04-26 DIAGNOSIS — H11423 Conjunctival edema, bilateral: Secondary | ICD-10-CM | POA: Diagnosis not present

## 2017-04-26 DIAGNOSIS — H401134 Primary open-angle glaucoma, bilateral, indeterminate stage: Secondary | ICD-10-CM | POA: Diagnosis not present

## 2017-04-26 DIAGNOSIS — H18413 Arcus senilis, bilateral: Secondary | ICD-10-CM | POA: Diagnosis not present

## 2017-04-26 DIAGNOSIS — H353210 Exudative age-related macular degeneration, right eye, stage unspecified: Secondary | ICD-10-CM | POA: Diagnosis not present

## 2017-04-26 DIAGNOSIS — Z961 Presence of intraocular lens: Secondary | ICD-10-CM | POA: Diagnosis not present

## 2017-04-26 DIAGNOSIS — H11153 Pinguecula, bilateral: Secondary | ICD-10-CM | POA: Diagnosis not present

## 2017-04-26 DIAGNOSIS — H52223 Regular astigmatism, bilateral: Secondary | ICD-10-CM | POA: Diagnosis not present

## 2017-04-26 DIAGNOSIS — H5203 Hypermetropia, bilateral: Secondary | ICD-10-CM | POA: Diagnosis not present

## 2017-04-26 DIAGNOSIS — H353120 Nonexudative age-related macular degeneration, left eye, stage unspecified: Secondary | ICD-10-CM | POA: Diagnosis not present

## 2017-04-27 DIAGNOSIS — R69 Illness, unspecified: Secondary | ICD-10-CM | POA: Diagnosis not present

## 2017-05-12 ENCOUNTER — Ambulatory Visit (INDEPENDENT_AMBULATORY_CARE_PROVIDER_SITE_OTHER): Payer: Medicare HMO | Admitting: Family

## 2017-05-12 ENCOUNTER — Encounter: Payer: Self-pay | Admitting: Family

## 2017-05-12 ENCOUNTER — Ambulatory Visit (HOSPITAL_COMMUNITY)
Admission: RE | Admit: 2017-05-12 | Discharge: 2017-05-12 | Disposition: A | Discharge status: Home / Self Care | Payer: Medicare HMO | Source: Ambulatory Visit | Attending: Family | Admitting: Family

## 2017-05-12 VITALS — BP 170/88 | HR 55 | Temp 97.6°F | Resp 17 | Wt 212.6 lb

## 2017-05-12 DIAGNOSIS — I714 Abdominal aortic aneurysm, without rupture, unspecified: Secondary | ICD-10-CM

## 2017-05-12 DIAGNOSIS — R0989 Other specified symptoms and signs involving the circulatory and respiratory systems: Secondary | ICD-10-CM

## 2017-05-12 NOTE — Patient Instructions (Addendum)
Before your next abdominal ultrasound:  Take two Extra-Strength Gas-X capsules at bedtime the night before the test. Take another two Extra-Strength Gas-X capsules 3 hours before the test.  Avoid gas forming foods the day before the test.       Abdominal Aortic Aneurysm Blood pumps away from the heart through tubes (blood vessels) called arteries. Aneurysms are weak or damaged places in the wall of an artery. It bulges out like a balloon. An abdominal aortic aneurysm happens in the main artery of the body (aorta). It can burst or tear, causing bleeding inside the body. This is an emergency. It needs treatment right away. What are the causes? The exact cause is unknown. Things that could cause this problem include:  Fat and other substances building up in the lining of a tube.  Swelling of the walls of a blood vessel.  Certain tissue diseases.  Belly (abdominal) trauma.  An infection in the main artery of the body.  What increases the risk? There are things that make it more likely for you to have an aneurysm. These include:  Being over the age of 81 years old.  Having high blood pressure (hypertension).  Being a male.  Being white.  Being very overweight (obese).  Having a family history of aneurysm.  Using tobacco products.  What are the signs or symptoms? Symptoms depend on the size of the aneurysm and how fast it grows. There may not be symptoms. If symptoms occur, they can include:  Pain (belly, side, lower back, or groin).  Feeling full after eating a small amount of food.  Feeling sick to your stomach (nauseous), throwing up (vomiting), or both.  Feeling a lump in your belly that feels like it is beating (pulsating).  Feeling like you will pass out (faint).  How is this treated?  Medicine to control blood pressure and pain.  Imaging tests to see if the aneurysm gets bigger.  Surgery. How is this prevented? To lessen your chance of getting this  condition:  Stop smoking. Stop chewing tobacco.  Limit or avoid alcohol.  Keep your blood pressure, blood sugar, and cholesterol within normal limits.  Eat less salt.  Eat foods low in saturated fats and cholesterol. These are found in animal and whole dairy products.  Eat more fiber. Fiber is found in whole grains, vegetables, and fruits.  Keep a healthy weight.  Stay active and exercise often.  This information is not intended to replace advice given to you by your health care provider. Make sure you discuss any questions you have with your health care provider. Document Released: 09/24/2012 Document Revised: 11/05/2015 Document Reviewed: 06/29/2012 Elsevier Interactive Patient Education  2017 [employer]  

## 2017-05-12 NOTE — Progress Notes (Signed)
[facility] [place]   CC: Follow up Abdominal Aortic Aneurysm  History of Present Illness  Ethan Walters is a 83 y.o. (03-05-1934) male who returns for follow up of his AAA. Dr. Bridgett Larsson has been monitoring pt.  Previous studies demonstrate an AAA, measuring 4.9 cm x 4.5 cm.  The patient does not have back or abdominal pain.  The patient is not a smoker.  He states he does not walk much as his balance is off, uses a cane, but does not seem to indicate claudication sx's with walking.  The patient denies history of stroke or TIA symptoms.  Pt states he checks his blood pressure was 170/89 most recently at home. He states his next appointment with his PCP is in a couple of weeks. Wife states he has a "chronic kidney problem". He states that over a year he urinates frequently; I advised pt to let his PCP know this.  He states he has been dyspneic ever since he quit smoking in 2016.   Pt Diabetic: Yes, pt states he does not know what his A1C is, but states his FBS yesterday was 200, "it's just gone up steadily".  Pt smoker: former smoker, states he quit in 2016, started at age 65   Past Medical History:  Diagnosis Date  . AAA (abdominal aortic aneurysm) ([place])   . Diabetes mellitus without complication ([place])   . Hypertension    Past Surgical History:  Procedure Laterality Date  . APPENDECTOMY  09/17/2015  . HERNIA REPAIR     Social History Social History   Socioeconomic History  . Marital status: Married    Spouse name: Not on file  . Number of children: Not on file  . Years of education: Not on file  . Highest education level: Not on file  Social Needs  . Financial resource strain: Not on file  . Food insecurity - worry: Not on file  . Food insecurity - inability: Not on file  . Transportation needs - medical: Not on file  . Transportation needs - non-medical: Not on file  Occupational History  . Not on file  Tobacco Use  . Smoking status: Never Smoker   . Smokeless tobacco: Never Used  Substance and Sexual Activity  . Alcohol use: No    Alcohol/week: 0.0 oz  . Drug use: No  . Sexual activity: Not on file  Other Topics Concern  . Not on file  Social History Narrative  . Not on file   Family History No family history on file.  Current Outpatient Medications on File Prior to Visit  Medication Sig Dispense Refill  . albuterol (PROAIR HFA) 108 (90 Base) MCG/ACT inhaler     . aspirin EC 81 MG tablet Take by mouth.    . Cholecalciferol (VITAMIN D3) 2000 units capsule Take by mouth.    . colchicine 0.6 MG tablet     . glucose blood (ONE TOUCH ULTRA TEST) test strip     . Liraglutide (VICTOZA) 18 MG/3ML SOPN     . lisinopril-hydrochlorothiazide (PRINZIDE,ZESTORETIC) 20-12.5 MG tablet     . simvastatin (ZOCOR) 40 MG tablet Take 40 mg by mouth.    . vitamin C (ASCORBIC ACID) 500 MG tablet Take 500 mg by mouth.     No current facility-administered medications on file prior to visit.    Allergies  Allergen Reactions  . Neosporin [Neomycin-Bacitracin Zn-Polymyx] Swelling    ROS: See HPI for pertinent positives and negatives.  Physical Examination  Vitals:   05/12/17 0841 05/12/17 0843  BP: (!) 172/96 (!) 170/88  Pulse: (!) 55   Resp: 17   Temp: 97.6 F (36.4 C)   TempSrc: Oral   SpO2: 95%   Weight: 212 lb 9.6 oz (96.4 kg)    Body mass index is 31.4 kg/m.  General:                                              Alert, O x 3, WD, NAD, obese male, using cane to walk     Pulmonary Sym exp, respirations are mildly labored at rest, limited B air movt, rales in halfway up bilateral posterior fields, no rhonchi or wheezes   Cardiac RRR, Nl S1, S2, no Murmurs, No rubs, No S3,S4   Vascular Vessel Right Left  Radial Palpable Palpable  Brachial Palpable Palpable  Carotid Palpable, No Bruit Palpable, No Bruit  Aorta Not palpable N/A  Femoral Palpable Palpable  Popliteal 2+ palpable Not palpable  PT Not palpable Not palpable  DP  Faintly palpable Faintly palpable    Gastrointestinal soft, non-distended, non-tender to palpation, No guarding or rebound, no HSM, no masses, no CVAT B, No palpable prominent aortic pulse,    Musculoskeletal M/S 5/5 throughout  , Extremities without ischemic changes, trace pitting edema present in ankles, Varicosities, present: R>L, hemosiderin deposits present: B shin  Neurologic Pain and light touch intact in extremities , Motor exam as listed above      DATA  AAA Duplex (05/12/2017):  Previous size (Date: 10-28-16):  5.1 cm x 4.5 cm   R CIA: 1.4 cm x 1.3 cm  L CIA: 1.6 cm x 1.7 cm   Current size:  5.3 cm (Date: 05-12-17); Right CIA: 1.2 cm; Left CIA: 1.3 cm. Decreased visualization of the abdominal vasculature due to overlying bowel gas and pt body habitus. No significant stenosis of the abdominal aorta and bilateral proximal common iliac arteries.  SMA velocity 263 cm/s. Threshold for >70% stenosis is 275 cm/s.   Medical Decision Making  The patient is a 83 y.o. male who presents with asymptomatic AAA with slight increase in size, to 5.3 cm today, compared to 5.1 cm on 10-28-16, based on limited visualization. He has diminished pedal pulses, does not seem to walk enough to elicit claudication symptoms, will check ABI's on his return. Prominent right popliteal pulse, will check right popliteal artery duplex on his return. Bilateral lower legs varicosities: gave pamphlet for ETI, advised knee high compression hose, he lives in [place].    Based on this patient's exam and diagnostic studies, the patient will follow up in 6 months  with the following studies: AAA duplex, right popliteal artery duplex, ABI's.  Consideration for repair of AAA would be made when the size is 5.5 cm, growth > 1 cm/yr, and symptomatic status.        The patient was given information about AAA including signs, symptoms, treatment, and how to minimize the risk of enlargement and rupture of aneurysms.     I emphasized the importance of maximal medical management including strict control of blood pressure, blood glucose, and lipid levels, antiplatelet agents, obtaining regular exercise, and continued cessation of smoking.   The patient was advised to call 911 should the patient experience sudden onset abdominal or back pain.   Thank you for allowing [place] to participate in this  patient's care.  Clemon Chambers, RN, MSN, FNP-C [facility] [place] Office: [phone]  Clinic Physician: Donzetta Matters  05/12/2017, 8:50 AM

## 2017-05-15 DIAGNOSIS — N400 Enlarged prostate without lower urinary tract symptoms: Secondary | ICD-10-CM | POA: Diagnosis not present

## 2017-05-15 DIAGNOSIS — N183 Chronic kidney disease, stage 3 (moderate): Secondary | ICD-10-CM | POA: Diagnosis not present

## 2017-05-15 DIAGNOSIS — I1310 Hypertensive heart and chronic kidney disease without heart failure, with stage 1 through stage 4 chronic kidney disease, or unspecified chronic kidney disease: Secondary | ICD-10-CM | POA: Diagnosis not present

## 2017-05-25 DIAGNOSIS — Z794 Long term (current) use of insulin: Secondary | ICD-10-CM | POA: Diagnosis not present

## 2017-05-25 DIAGNOSIS — S61409A Unspecified open wound of unspecified hand, initial encounter: Secondary | ICD-10-CM | POA: Diagnosis not present

## 2017-05-25 DIAGNOSIS — E1122 Type 2 diabetes mellitus with diabetic chronic kidney disease: Secondary | ICD-10-CM | POA: Diagnosis not present

## 2017-05-25 DIAGNOSIS — E1165 Type 2 diabetes mellitus with hyperglycemia: Secondary | ICD-10-CM | POA: Diagnosis not present

## 2017-05-25 DIAGNOSIS — N183 Chronic kidney disease, stage 3 (moderate): Secondary | ICD-10-CM | POA: Diagnosis not present

## 2017-05-25 DIAGNOSIS — I1310 Hypertensive heart and chronic kidney disease without heart failure, with stage 1 through stage 4 chronic kidney disease, or unspecified chronic kidney disease: Secondary | ICD-10-CM | POA: Diagnosis not present

## 2017-05-25 NOTE — Addendum Note (Signed)
Addended by: Lianne Cure A on: 05/25/2017 04:26 PM   Modules accepted: Orders

## 2017-05-26 DIAGNOSIS — E870 Hyperosmolality and hypernatremia: Secondary | ICD-10-CM | POA: Diagnosis not present

## 2017-06-07 DIAGNOSIS — Z794 Long term (current) use of insulin: Secondary | ICD-10-CM | POA: Diagnosis not present

## 2017-06-07 DIAGNOSIS — N183 Chronic kidney disease, stage 3 (moderate): Secondary | ICD-10-CM | POA: Diagnosis not present

## 2017-06-07 DIAGNOSIS — E1122 Type 2 diabetes mellitus with diabetic chronic kidney disease: Secondary | ICD-10-CM | POA: Diagnosis not present

## 2017-06-07 DIAGNOSIS — E1165 Type 2 diabetes mellitus with hyperglycemia: Secondary | ICD-10-CM | POA: Diagnosis not present

## 2017-06-07 DIAGNOSIS — I1310 Hypertensive heart and chronic kidney disease without heart failure, with stage 1 through stage 4 chronic kidney disease, or unspecified chronic kidney disease: Secondary | ICD-10-CM | POA: Diagnosis not present

## 2017-06-17 DIAGNOSIS — E1165 Type 2 diabetes mellitus with hyperglycemia: Secondary | ICD-10-CM | POA: Diagnosis not present

## 2017-06-17 DIAGNOSIS — J441 Chronic obstructive pulmonary disease with (acute) exacerbation: Secondary | ICD-10-CM | POA: Diagnosis not present

## 2017-06-17 DIAGNOSIS — M109 Gout, unspecified: Secondary | ICD-10-CM | POA: Diagnosis not present

## 2017-06-17 DIAGNOSIS — E669 Obesity, unspecified: Secondary | ICD-10-CM | POA: Diagnosis not present

## 2017-06-17 DIAGNOSIS — N183 Chronic kidney disease, stage 3 (moderate): Secondary | ICD-10-CM | POA: Diagnosis not present

## 2017-06-17 DIAGNOSIS — E1122 Type 2 diabetes mellitus with diabetic chronic kidney disease: Secondary | ICD-10-CM | POA: Diagnosis not present

## 2017-06-17 DIAGNOSIS — J181 Lobar pneumonia, unspecified organism: Secondary | ICD-10-CM | POA: Diagnosis not present

## 2017-06-17 DIAGNOSIS — I1310 Hypertensive heart and chronic kidney disease without heart failure, with stage 1 through stage 4 chronic kidney disease, or unspecified chronic kidney disease: Secondary | ICD-10-CM | POA: Diagnosis not present

## 2017-06-17 DIAGNOSIS — Z794 Long term (current) use of insulin: Secondary | ICD-10-CM | POA: Diagnosis not present

## 2017-06-17 DIAGNOSIS — N3281 Overactive bladder: Secondary | ICD-10-CM | POA: Diagnosis not present

## 2017-06-17 DIAGNOSIS — Z87891 Personal history of nicotine dependence: Secondary | ICD-10-CM | POA: Diagnosis not present

## 2017-06-17 DIAGNOSIS — Z6830 Body mass index (BMI) 30.0-30.9, adult: Secondary | ICD-10-CM | POA: Diagnosis not present

## 2017-06-17 DIAGNOSIS — I5032 Chronic diastolic (congestive) heart failure: Secondary | ICD-10-CM | POA: Diagnosis not present

## 2017-06-17 DIAGNOSIS — Z9981 Dependence on supplemental oxygen: Secondary | ICD-10-CM | POA: Diagnosis not present

## 2017-06-17 DIAGNOSIS — J440 Chronic obstructive pulmonary disease with acute lower respiratory infection: Secondary | ICD-10-CM | POA: Diagnosis not present

## 2017-06-17 DIAGNOSIS — J449 Chronic obstructive pulmonary disease, unspecified: Secondary | ICD-10-CM | POA: Diagnosis not present

## 2017-06-20 DIAGNOSIS — I1310 Hypertensive heart and chronic kidney disease without heart failure, with stage 1 through stage 4 chronic kidney disease, or unspecified chronic kidney disease: Secondary | ICD-10-CM | POA: Diagnosis not present

## 2017-06-20 DIAGNOSIS — E1165 Type 2 diabetes mellitus with hyperglycemia: Secondary | ICD-10-CM | POA: Diagnosis not present

## 2017-06-20 DIAGNOSIS — N183 Chronic kidney disease, stage 3 (moderate): Secondary | ICD-10-CM | POA: Diagnosis not present

## 2017-06-20 DIAGNOSIS — Z87891 Personal history of nicotine dependence: Secondary | ICD-10-CM | POA: Diagnosis not present

## 2017-06-20 DIAGNOSIS — Z794 Long term (current) use of insulin: Secondary | ICD-10-CM | POA: Diagnosis not present

## 2017-06-20 DIAGNOSIS — J449 Chronic obstructive pulmonary disease, unspecified: Secondary | ICD-10-CM | POA: Diagnosis not present

## 2017-06-20 DIAGNOSIS — M109 Gout, unspecified: Secondary | ICD-10-CM | POA: Diagnosis not present

## 2017-06-20 DIAGNOSIS — E669 Obesity, unspecified: Secondary | ICD-10-CM | POA: Diagnosis not present

## 2017-06-20 DIAGNOSIS — Z6830 Body mass index (BMI) 30.0-30.9, adult: Secondary | ICD-10-CM | POA: Diagnosis not present

## 2017-06-20 DIAGNOSIS — E1122 Type 2 diabetes mellitus with diabetic chronic kidney disease: Secondary | ICD-10-CM | POA: Diagnosis not present

## 2017-06-23 DIAGNOSIS — N183 Chronic kidney disease, stage 3 (moderate): Secondary | ICD-10-CM | POA: Diagnosis not present

## 2017-06-23 DIAGNOSIS — J449 Chronic obstructive pulmonary disease, unspecified: Secondary | ICD-10-CM | POA: Diagnosis not present

## 2017-06-23 DIAGNOSIS — Z6830 Body mass index (BMI) 30.0-30.9, adult: Secondary | ICD-10-CM | POA: Diagnosis not present

## 2017-06-23 DIAGNOSIS — E669 Obesity, unspecified: Secondary | ICD-10-CM | POA: Diagnosis not present

## 2017-06-23 DIAGNOSIS — Z87891 Personal history of nicotine dependence: Secondary | ICD-10-CM | POA: Diagnosis not present

## 2017-06-23 DIAGNOSIS — I1310 Hypertensive heart and chronic kidney disease without heart failure, with stage 1 through stage 4 chronic kidney disease, or unspecified chronic kidney disease: Secondary | ICD-10-CM | POA: Diagnosis not present

## 2017-06-23 DIAGNOSIS — E1122 Type 2 diabetes mellitus with diabetic chronic kidney disease: Secondary | ICD-10-CM | POA: Diagnosis not present

## 2017-06-23 DIAGNOSIS — Z794 Long term (current) use of insulin: Secondary | ICD-10-CM | POA: Diagnosis not present

## 2017-06-23 DIAGNOSIS — E1165 Type 2 diabetes mellitus with hyperglycemia: Secondary | ICD-10-CM | POA: Diagnosis not present

## 2017-06-23 DIAGNOSIS — M109 Gout, unspecified: Secondary | ICD-10-CM | POA: Diagnosis not present

## 2017-06-27 DIAGNOSIS — H35041 Retinal micro-aneurysms, unspecified, right eye: Secondary | ICD-10-CM | POA: Diagnosis not present

## 2017-06-27 DIAGNOSIS — H43811 Vitreous degeneration, right eye: Secondary | ICD-10-CM | POA: Diagnosis not present

## 2017-06-27 DIAGNOSIS — H348312 Tributary (branch) retinal vein occlusion, right eye, stable: Secondary | ICD-10-CM | POA: Diagnosis not present

## 2017-06-27 DIAGNOSIS — H35351 Cystoid macular degeneration, right eye: Secondary | ICD-10-CM | POA: Diagnosis not present

## 2017-06-29 DIAGNOSIS — E1122 Type 2 diabetes mellitus with diabetic chronic kidney disease: Secondary | ICD-10-CM | POA: Diagnosis not present

## 2017-06-29 DIAGNOSIS — M109 Gout, unspecified: Secondary | ICD-10-CM | POA: Diagnosis not present

## 2017-06-29 DIAGNOSIS — Z87891 Personal history of nicotine dependence: Secondary | ICD-10-CM | POA: Diagnosis not present

## 2017-06-29 DIAGNOSIS — I1310 Hypertensive heart and chronic kidney disease without heart failure, with stage 1 through stage 4 chronic kidney disease, or unspecified chronic kidney disease: Secondary | ICD-10-CM | POA: Diagnosis not present

## 2017-06-29 DIAGNOSIS — Z6830 Body mass index (BMI) 30.0-30.9, adult: Secondary | ICD-10-CM | POA: Diagnosis not present

## 2017-06-29 DIAGNOSIS — E1165 Type 2 diabetes mellitus with hyperglycemia: Secondary | ICD-10-CM | POA: Diagnosis not present

## 2017-06-29 DIAGNOSIS — E669 Obesity, unspecified: Secondary | ICD-10-CM | POA: Diagnosis not present

## 2017-06-29 DIAGNOSIS — Z794 Long term (current) use of insulin: Secondary | ICD-10-CM | POA: Diagnosis not present

## 2017-06-29 DIAGNOSIS — J449 Chronic obstructive pulmonary disease, unspecified: Secondary | ICD-10-CM | POA: Diagnosis not present

## 2017-06-29 DIAGNOSIS — N183 Chronic kidney disease, stage 3 (moderate): Secondary | ICD-10-CM | POA: Diagnosis not present

## 2017-07-05 DIAGNOSIS — R69 Illness, unspecified: Secondary | ICD-10-CM | POA: Diagnosis not present

## 2017-07-07 DIAGNOSIS — Z87891 Personal history of nicotine dependence: Secondary | ICD-10-CM | POA: Diagnosis not present

## 2017-07-07 DIAGNOSIS — N183 Chronic kidney disease, stage 3 (moderate): Secondary | ICD-10-CM | POA: Diagnosis not present

## 2017-07-07 DIAGNOSIS — Z794 Long term (current) use of insulin: Secondary | ICD-10-CM | POA: Diagnosis not present

## 2017-07-07 DIAGNOSIS — E1165 Type 2 diabetes mellitus with hyperglycemia: Secondary | ICD-10-CM | POA: Diagnosis not present

## 2017-07-07 DIAGNOSIS — I1310 Hypertensive heart and chronic kidney disease without heart failure, with stage 1 through stage 4 chronic kidney disease, or unspecified chronic kidney disease: Secondary | ICD-10-CM | POA: Diagnosis not present

## 2017-07-07 DIAGNOSIS — J449 Chronic obstructive pulmonary disease, unspecified: Secondary | ICD-10-CM | POA: Diagnosis not present

## 2017-07-07 DIAGNOSIS — M109 Gout, unspecified: Secondary | ICD-10-CM | POA: Diagnosis not present

## 2017-07-07 DIAGNOSIS — E1122 Type 2 diabetes mellitus with diabetic chronic kidney disease: Secondary | ICD-10-CM | POA: Diagnosis not present

## 2017-07-07 DIAGNOSIS — Z6830 Body mass index (BMI) 30.0-30.9, adult: Secondary | ICD-10-CM | POA: Diagnosis not present

## 2017-07-07 DIAGNOSIS — E669 Obesity, unspecified: Secondary | ICD-10-CM | POA: Diagnosis not present

## 2017-07-11 DIAGNOSIS — E1122 Type 2 diabetes mellitus with diabetic chronic kidney disease: Secondary | ICD-10-CM | POA: Diagnosis not present

## 2017-07-11 DIAGNOSIS — J209 Acute bronchitis, unspecified: Secondary | ICD-10-CM | POA: Diagnosis not present

## 2017-07-11 DIAGNOSIS — J440 Chronic obstructive pulmonary disease with acute lower respiratory infection: Secondary | ICD-10-CM | POA: Diagnosis not present

## 2017-07-11 DIAGNOSIS — Z789 Other specified health status: Secondary | ICD-10-CM | POA: Diagnosis not present

## 2017-07-11 DIAGNOSIS — I1310 Hypertensive heart and chronic kidney disease without heart failure, with stage 1 through stage 4 chronic kidney disease, or unspecified chronic kidney disease: Secondary | ICD-10-CM | POA: Diagnosis not present

## 2017-07-11 DIAGNOSIS — N183 Chronic kidney disease, stage 3 (moderate): Secondary | ICD-10-CM | POA: Diagnosis not present

## 2017-07-11 DIAGNOSIS — R05 Cough: Secondary | ICD-10-CM | POA: Diagnosis not present

## 2017-07-13 DIAGNOSIS — J449 Chronic obstructive pulmonary disease, unspecified: Secondary | ICD-10-CM | POA: Diagnosis not present

## 2017-07-13 DIAGNOSIS — Z794 Long term (current) use of insulin: Secondary | ICD-10-CM | POA: Diagnosis not present

## 2017-07-13 DIAGNOSIS — Z6830 Body mass index (BMI) 30.0-30.9, adult: Secondary | ICD-10-CM | POA: Diagnosis not present

## 2017-07-13 DIAGNOSIS — I1310 Hypertensive heart and chronic kidney disease without heart failure, with stage 1 through stage 4 chronic kidney disease, or unspecified chronic kidney disease: Secondary | ICD-10-CM | POA: Diagnosis not present

## 2017-07-13 DIAGNOSIS — E1122 Type 2 diabetes mellitus with diabetic chronic kidney disease: Secondary | ICD-10-CM | POA: Diagnosis not present

## 2017-07-13 DIAGNOSIS — M109 Gout, unspecified: Secondary | ICD-10-CM | POA: Diagnosis not present

## 2017-07-13 DIAGNOSIS — E1165 Type 2 diabetes mellitus with hyperglycemia: Secondary | ICD-10-CM | POA: Diagnosis not present

## 2017-07-13 DIAGNOSIS — E669 Obesity, unspecified: Secondary | ICD-10-CM | POA: Diagnosis not present

## 2017-07-13 DIAGNOSIS — Z87891 Personal history of nicotine dependence: Secondary | ICD-10-CM | POA: Diagnosis not present

## 2017-07-13 DIAGNOSIS — N183 Chronic kidney disease, stage 3 (moderate): Secondary | ICD-10-CM | POA: Diagnosis not present

## 2017-07-18 DIAGNOSIS — R0602 Shortness of breath: Secondary | ICD-10-CM | POA: Diagnosis not present

## 2017-07-18 DIAGNOSIS — E669 Obesity, unspecified: Secondary | ICD-10-CM | POA: Diagnosis not present

## 2017-07-18 DIAGNOSIS — E1169 Type 2 diabetes mellitus with other specified complication: Secondary | ICD-10-CM | POA: Diagnosis not present

## 2017-07-18 DIAGNOSIS — I5033 Acute on chronic diastolic (congestive) heart failure: Secondary | ICD-10-CM | POA: Diagnosis not present

## 2017-07-18 DIAGNOSIS — I10 Essential (primary) hypertension: Secondary | ICD-10-CM | POA: Diagnosis not present

## 2017-07-18 DIAGNOSIS — I130 Hypertensive heart and chronic kidney disease with heart failure and stage 1 through stage 4 chronic kidney disease, or unspecified chronic kidney disease: Secondary | ICD-10-CM | POA: Diagnosis not present

## 2017-07-18 DIAGNOSIS — I1310 Hypertensive heart and chronic kidney disease without heart failure, with stage 1 through stage 4 chronic kidney disease, or unspecified chronic kidney disease: Secondary | ICD-10-CM | POA: Diagnosis not present

## 2017-07-18 DIAGNOSIS — E1122 Type 2 diabetes mellitus with diabetic chronic kidney disease: Secondary | ICD-10-CM | POA: Diagnosis not present

## 2017-07-18 DIAGNOSIS — R0902 Hypoxemia: Secondary | ICD-10-CM | POA: Diagnosis not present

## 2017-07-18 DIAGNOSIS — J449 Chronic obstructive pulmonary disease, unspecified: Secondary | ICD-10-CM | POA: Diagnosis not present

## 2017-07-18 DIAGNOSIS — Z87891 Personal history of nicotine dependence: Secondary | ICD-10-CM | POA: Diagnosis not present

## 2017-07-18 DIAGNOSIS — R05 Cough: Secondary | ICD-10-CM | POA: Diagnosis not present

## 2017-07-18 DIAGNOSIS — N189 Chronic kidney disease, unspecified: Secondary | ICD-10-CM | POA: Diagnosis not present

## 2017-07-18 DIAGNOSIS — R69 Illness, unspecified: Secondary | ICD-10-CM | POA: Diagnosis not present

## 2017-07-18 DIAGNOSIS — E872 Acidosis: Secondary | ICD-10-CM | POA: Diagnosis not present

## 2017-07-18 DIAGNOSIS — E1165 Type 2 diabetes mellitus with hyperglycemia: Secondary | ICD-10-CM | POA: Diagnosis not present

## 2017-07-18 DIAGNOSIS — J441 Chronic obstructive pulmonary disease with (acute) exacerbation: Secondary | ICD-10-CM | POA: Diagnosis not present

## 2017-07-18 DIAGNOSIS — J9601 Acute respiratory failure with hypoxia: Secondary | ICD-10-CM | POA: Diagnosis not present

## 2017-07-18 DIAGNOSIS — Z794 Long term (current) use of insulin: Secondary | ICD-10-CM | POA: Diagnosis not present

## 2017-07-18 DIAGNOSIS — J189 Pneumonia, unspecified organism: Secondary | ICD-10-CM | POA: Diagnosis not present

## 2017-07-18 DIAGNOSIS — J440 Chronic obstructive pulmonary disease with acute lower respiratory infection: Secondary | ICD-10-CM | POA: Diagnosis not present

## 2017-07-18 DIAGNOSIS — Z9119 Patient's noncompliance with other medical treatment and regimen: Secondary | ICD-10-CM | POA: Diagnosis not present

## 2017-07-18 DIAGNOSIS — N183 Chronic kidney disease, stage 3 (moderate): Secondary | ICD-10-CM | POA: Diagnosis not present

## 2017-07-19 DIAGNOSIS — E872 Acidosis: Secondary | ICD-10-CM | POA: Diagnosis not present

## 2017-07-19 DIAGNOSIS — E1169 Type 2 diabetes mellitus with other specified complication: Secondary | ICD-10-CM | POA: Diagnosis not present

## 2017-07-19 DIAGNOSIS — J189 Pneumonia, unspecified organism: Secondary | ICD-10-CM | POA: Diagnosis not present

## 2017-07-19 DIAGNOSIS — J9601 Acute respiratory failure with hypoxia: Secondary | ICD-10-CM | POA: Diagnosis not present

## 2017-07-19 DIAGNOSIS — I10 Essential (primary) hypertension: Secondary | ICD-10-CM | POA: Diagnosis not present

## 2017-07-19 DIAGNOSIS — Z87891 Personal history of nicotine dependence: Secondary | ICD-10-CM | POA: Diagnosis not present

## 2017-07-19 DIAGNOSIS — E669 Obesity, unspecified: Secondary | ICD-10-CM | POA: Diagnosis not present

## 2017-07-19 DIAGNOSIS — J441 Chronic obstructive pulmonary disease with (acute) exacerbation: Secondary | ICD-10-CM | POA: Diagnosis not present

## 2017-07-20 DIAGNOSIS — E669 Obesity, unspecified: Secondary | ICD-10-CM | POA: Diagnosis not present

## 2017-07-20 DIAGNOSIS — E872 Acidosis: Secondary | ICD-10-CM | POA: Diagnosis not present

## 2017-07-20 DIAGNOSIS — J189 Pneumonia, unspecified organism: Secondary | ICD-10-CM | POA: Diagnosis not present

## 2017-07-20 DIAGNOSIS — R0602 Shortness of breath: Secondary | ICD-10-CM | POA: Diagnosis not present

## 2017-07-20 DIAGNOSIS — J9601 Acute respiratory failure with hypoxia: Secondary | ICD-10-CM | POA: Diagnosis not present

## 2017-07-20 DIAGNOSIS — J441 Chronic obstructive pulmonary disease with (acute) exacerbation: Secondary | ICD-10-CM | POA: Diagnosis not present

## 2017-07-20 DIAGNOSIS — Z87891 Personal history of nicotine dependence: Secondary | ICD-10-CM | POA: Diagnosis not present

## 2017-07-20 DIAGNOSIS — I10 Essential (primary) hypertension: Secondary | ICD-10-CM | POA: Diagnosis not present

## 2017-07-20 DIAGNOSIS — E1169 Type 2 diabetes mellitus with other specified complication: Secondary | ICD-10-CM | POA: Diagnosis not present

## 2017-07-21 DIAGNOSIS — Z87891 Personal history of nicotine dependence: Secondary | ICD-10-CM | POA: Diagnosis not present

## 2017-07-21 DIAGNOSIS — J441 Chronic obstructive pulmonary disease with (acute) exacerbation: Secondary | ICD-10-CM | POA: Diagnosis not present

## 2017-07-21 DIAGNOSIS — J9601 Acute respiratory failure with hypoxia: Secondary | ICD-10-CM | POA: Diagnosis not present

## 2017-07-21 DIAGNOSIS — R69 Illness, unspecified: Secondary | ICD-10-CM | POA: Diagnosis not present

## 2017-07-21 DIAGNOSIS — E1169 Type 2 diabetes mellitus with other specified complication: Secondary | ICD-10-CM | POA: Diagnosis not present

## 2017-07-21 DIAGNOSIS — J449 Chronic obstructive pulmonary disease, unspecified: Secondary | ICD-10-CM | POA: Diagnosis not present

## 2017-07-21 DIAGNOSIS — E669 Obesity, unspecified: Secondary | ICD-10-CM | POA: Diagnosis not present

## 2017-07-21 DIAGNOSIS — J189 Pneumonia, unspecified organism: Secondary | ICD-10-CM | POA: Diagnosis not present

## 2017-07-21 DIAGNOSIS — E872 Acidosis: Secondary | ICD-10-CM | POA: Diagnosis not present

## 2017-07-21 DIAGNOSIS — I10 Essential (primary) hypertension: Secondary | ICD-10-CM | POA: Diagnosis not present

## 2017-07-22 DIAGNOSIS — N183 Chronic kidney disease, stage 3 (moderate): Secondary | ICD-10-CM | POA: Diagnosis not present

## 2017-07-22 DIAGNOSIS — I5032 Chronic diastolic (congestive) heart failure: Secondary | ICD-10-CM | POA: Diagnosis not present

## 2017-07-22 DIAGNOSIS — J441 Chronic obstructive pulmonary disease with (acute) exacerbation: Secondary | ICD-10-CM | POA: Diagnosis not present

## 2017-07-22 DIAGNOSIS — E1165 Type 2 diabetes mellitus with hyperglycemia: Secondary | ICD-10-CM | POA: Diagnosis not present

## 2017-07-22 DIAGNOSIS — N3281 Overactive bladder: Secondary | ICD-10-CM | POA: Diagnosis not present

## 2017-07-22 DIAGNOSIS — J440 Chronic obstructive pulmonary disease with acute lower respiratory infection: Secondary | ICD-10-CM | POA: Diagnosis not present

## 2017-07-22 DIAGNOSIS — E1122 Type 2 diabetes mellitus with diabetic chronic kidney disease: Secondary | ICD-10-CM | POA: Diagnosis not present

## 2017-07-22 DIAGNOSIS — I1310 Hypertensive heart and chronic kidney disease without heart failure, with stage 1 through stage 4 chronic kidney disease, or unspecified chronic kidney disease: Secondary | ICD-10-CM | POA: Diagnosis not present

## 2017-07-22 DIAGNOSIS — J181 Lobar pneumonia, unspecified organism: Secondary | ICD-10-CM | POA: Diagnosis not present

## 2017-07-22 DIAGNOSIS — M109 Gout, unspecified: Secondary | ICD-10-CM | POA: Diagnosis not present

## 2017-07-24 DIAGNOSIS — E119 Type 2 diabetes mellitus without complications: Secondary | ICD-10-CM | POA: Diagnosis not present

## 2017-07-24 DIAGNOSIS — J441 Chronic obstructive pulmonary disease with (acute) exacerbation: Secondary | ICD-10-CM | POA: Diagnosis not present

## 2017-07-24 DIAGNOSIS — I5030 Unspecified diastolic (congestive) heart failure: Secondary | ICD-10-CM | POA: Diagnosis not present

## 2017-07-24 DIAGNOSIS — I10 Essential (primary) hypertension: Secondary | ICD-10-CM | POA: Diagnosis not present

## 2017-07-24 DIAGNOSIS — Z9189 Other specified personal risk factors, not elsewhere classified: Secondary | ICD-10-CM | POA: Diagnosis not present

## 2017-07-24 DIAGNOSIS — J181 Lobar pneumonia, unspecified organism: Secondary | ICD-10-CM | POA: Diagnosis not present

## 2017-07-24 DIAGNOSIS — N3281 Overactive bladder: Secondary | ICD-10-CM | POA: Diagnosis not present

## 2017-07-25 DIAGNOSIS — J441 Chronic obstructive pulmonary disease with (acute) exacerbation: Secondary | ICD-10-CM | POA: Diagnosis not present

## 2017-07-25 DIAGNOSIS — I509 Heart failure, unspecified: Secondary | ICD-10-CM | POA: Diagnosis not present

## 2017-07-26 ENCOUNTER — Encounter: Payer: Self-pay | Admitting: Cardiology

## 2017-07-26 ENCOUNTER — Ambulatory Visit: Payer: Medicare HMO | Admitting: Cardiology

## 2017-07-26 ENCOUNTER — Other Ambulatory Visit: Payer: Self-pay

## 2017-07-26 VITALS — BP 118/80 | HR 86 | Ht 69.0 in | Wt 215.0 lb

## 2017-07-26 DIAGNOSIS — J441 Chronic obstructive pulmonary disease with (acute) exacerbation: Secondary | ICD-10-CM | POA: Diagnosis not present

## 2017-07-26 DIAGNOSIS — I7090 Unspecified atherosclerosis: Secondary | ICD-10-CM | POA: Diagnosis not present

## 2017-07-26 DIAGNOSIS — R0609 Other forms of dyspnea: Secondary | ICD-10-CM

## 2017-07-26 DIAGNOSIS — I714 Abdominal aortic aneurysm, without rupture, unspecified: Secondary | ICD-10-CM

## 2017-07-26 DIAGNOSIS — R600 Localized edema: Secondary | ICD-10-CM | POA: Diagnosis not present

## 2017-07-26 DIAGNOSIS — E1122 Type 2 diabetes mellitus with diabetic chronic kidney disease: Secondary | ICD-10-CM | POA: Diagnosis not present

## 2017-07-26 DIAGNOSIS — J440 Chronic obstructive pulmonary disease with acute lower respiratory infection: Secondary | ICD-10-CM | POA: Diagnosis not present

## 2017-07-26 DIAGNOSIS — N3281 Overactive bladder: Secondary | ICD-10-CM | POA: Diagnosis not present

## 2017-07-26 DIAGNOSIS — E1165 Type 2 diabetes mellitus with hyperglycemia: Secondary | ICD-10-CM | POA: Diagnosis not present

## 2017-07-26 DIAGNOSIS — I5032 Chronic diastolic (congestive) heart failure: Secondary | ICD-10-CM | POA: Diagnosis not present

## 2017-07-26 DIAGNOSIS — N183 Chronic kidney disease, stage 3 (moderate): Secondary | ICD-10-CM | POA: Diagnosis not present

## 2017-07-26 DIAGNOSIS — I1310 Hypertensive heart and chronic kidney disease without heart failure, with stage 1 through stage 4 chronic kidney disease, or unspecified chronic kidney disease: Secondary | ICD-10-CM | POA: Diagnosis not present

## 2017-07-26 DIAGNOSIS — F17210 Nicotine dependence, cigarettes, uncomplicated: Secondary | ICD-10-CM | POA: Insufficient documentation

## 2017-07-26 DIAGNOSIS — M109 Gout, unspecified: Secondary | ICD-10-CM | POA: Diagnosis not present

## 2017-07-26 DIAGNOSIS — J181 Lobar pneumonia, unspecified organism: Secondary | ICD-10-CM | POA: Diagnosis not present

## 2017-07-26 DIAGNOSIS — R69 Illness, unspecified: Secondary | ICD-10-CM | POA: Diagnosis not present

## 2017-07-26 NOTE — Patient Instructions (Signed)
Medication Instructions:  Your physician recommends that you continue on your current medications as directed. Please refer to the Current Medication list given to you today.  Labwork: None  Testing/Procedures: Your physician has requested that you have an echocardiogram. Echocardiography is a painless test that uses sound waves to create images of your heart. It provides your doctor with information about the size and shape of your heart and how well your heart's chambers and valves are working. This procedure takes approximately one hour. There are no restrictions for this procedure.  Your physician has requested that you have a lexiscan myoview. For further information please visit [email or web address]. Please follow instruction sheet, as given.  Lower extremity vascular study  Follow-Up: Your physician recommends that you schedule a follow-up appointment in: 1 month  Any Other Special Instructions Will Be Listed Below (If Applicable).     If you need a refill on your cardiac medications before your next appointment, please call your pharmacy.   [place], RN, BSN

## 2017-07-26 NOTE — Progress Notes (Signed)
ekg 

## 2017-07-26 NOTE — Progress Notes (Signed)
Cardiology Office Note:    Date:  07/26/2017   ID:  Ethan Walters, DOB Nov 08, 1933, MRN [id number]  PCP:  Care, [place] [facility]  Cardiologist:  Jenean Lindau, MD   Referring MD: Care, [place]:    1. Abdominal aortic aneurysm (AAA) without rupture ([place])   2. Bilateral leg edema   3. DOE (dyspnea on exertion)   4. Continuous dependence on cigarette smoking   5. Atherosclerotic vascular disease   6. Pedal edema    PLAN:    In order of problems listed above:  1. Secondary prevention stressed with the patient.  Importance of compliance with diet and medications stressed and he vocalized understanding.  His blood pressure is stable.  His lipids are followed by his primary care physician.  The importance of this in reference to aortic aneurysm was emphasized and he vocalized understanding. 2. I spent 5 minutes with the patient discussing solely about smoking. Smoking cessation was counseled. I suggested to the patient also different medications and pharmacological interventions. Patient is keen to try stopping on its own at this time. He will get back to me if he needs any further assistance in this matter. 3. The patient will undergo Lexiscan sestamibi to assess his cardiovascular condition in view of multiple risk factors for coronary artery disease and sedentary lifestyle. 4. Echocardiogram will be done to assess murmur heard on auscultation. 5. Patient will undergo DVT study to assess bilateral pedal edema. 6. Patient will be seen in follow-up appointment in 1 months or earlier if the patient has any concerns    Medication Adjustments/Labs and Tests Ordered: Current medicines are reviewed at length with the patient today.  Concerns regarding medicines are outlined above.  Orders Placed This Encounter  Procedures  . MYOCARDIAL PERFUSION IMAGING  . EKG 12-Lead  . ECHOCARDIOGRAM COMPLETE   No orders of the defined types were placed in this  encounter.    Chief Complaint  Patient presents with  . Follow-up     History of Present Illness:    Ethan Walters is a 84 y.o. male.  The patient has past medical history of abdominal aortic aneurysm which is monitored by vascular surgery.  He has history of essential hypertension and dyslipidemia.  This is addressed by his primary care physician.  He denies any problems at this time.  He leads a sedentary lifestyle.  He is on oxygen for his COPD.  Unfortunately she continues to smoke though he has reduced the number of cigarettes that he smokes.  He was sent for a cardiovascular evaluation in view of very high risk patient for the same.  He also has pedal edema right greater than left.  At the time of my evaluation, the patient is alert awake oriented and in no distress.  He is seen today with her family member.  Past Medical History:  Diagnosis Date  . AAA (abdominal aortic aneurysm) ([place])   . Diabetes mellitus without complication ([place])   . Hypertension     Past Surgical History:  Procedure Laterality Date  . APPENDECTOMY  09/17/2015  . HERNIA REPAIR      Current Medications: Current Meds  Medication Sig  . albuterol (PROAIR HFA) 108 (90 Base) MCG/ACT inhaler   . amLODipine (NORVASC) 2.5 MG tablet   . amLODipine (NORVASC) 5 MG tablet   . aspirin EC 81 MG tablet Take by mouth.  Marland Kitchen azithromycin (ZITHROMAX) 250 MG tablet   . bisoprolol (  ZEBETA) 10 MG tablet   . Cholecalciferol (VITAMIN D3) 2000 units capsule Take by mouth.  . colchicine 0.6 MG tablet   . doxycycline (VIBRA-TABS) 100 MG tablet   . furosemide (LASIX) 40 MG tablet   . glucose blood (ONE TOUCH ULTRA TEST) test strip   . hydrALAZINE (APRESOLINE) 50 MG tablet   . ipratropium-albuterol (DUONEB) 0.5-2.5 (3) MG/3ML SOLN   . latanoprost (XALATAN) 0.005 % ophthalmic solution   . Liraglutide (VICTOZA) 18 MG/3ML SOPN   . lisinopril-hydrochlorothiazide (PRINZIDE,ZESTORETIC) 20-12.5 MG tablet   . losartan (COZAAR) 25  MG tablet   . metFORMIN (GLUCOPHAGE) 500 MG tablet   . Potassium Chloride ER 20 MEQ TBCR   . predniSONE (DELTASONE) 10 MG tablet   . simvastatin (ZOCOR) 40 MG tablet Take 40 mg by mouth.  . SYMBICORT 160-4.5 MCG/ACT inhaler   . TRADJENTA 5 MG TABS tablet   . TRELEGY ELLIPTA 100-62.5-25 MCG/INH AEPB   . TRESIBA FLEXTOUCH 100 UNIT/ML SOPN FlexTouch Pen   . vitamin C (ASCORBIC ACID) 500 MG tablet Take 500 mg by mouth.     Allergies:   Neosporin [neomycin-bacitracin zn-polymyx]   Social History   Socioeconomic History  . Marital status: Married    Spouse name: None  . Number of children: None  . Years of education: None  . Highest education level: None  Social Needs  . Financial resource strain: None  . Food insecurity - worry: None  . Food insecurity - inability: None  . Transportation needs - medical: None  . Transportation needs - non-medical: None  Occupational History  . None  Tobacco Use  . Smoking status: Never Smoker  . Smokeless tobacco: Never Used  Substance and Sexual Activity  . Alcohol use: No    Alcohol/week: 0.0 oz  . Drug use: No  . Sexual activity: None  Other Topics Concern  . None  Social History Narrative  . None     Family History: The patient's family history is not on file.  ROS:   Please see the history of present illness.    All other systems reviewed and are negative.  EKGs/Labs/Other Studies Reviewed:    The following studies were reviewed today: EKG reveals sinus rhythm, left axis deviation and nonspecific ST-T changes   Recent Labs: No results found for requested labs within last 8760 hours.  Recent Lipid Panel No results found for: CHOL, TRIG, HDL, CHOLHDL, VLDL, LDLCALC, LDLDIRECT  Physical Exam:    VS:  BP 118/80 (BP Location: Right Arm, Patient Position: Sitting, Cuff Size: Normal)   Pulse 86   Ht 5\' 9"  (1.753 m)   Wt 215 lb (97.5 kg)   SpO2 93%   BMI 31.75 kg/m     Wt Readings from Last 3 Encounters:  07/26/17 215  lb (97.5 kg)  05/12/17 212 lb 9.6 oz (96.4 kg)  10/28/16 202 lb 9.6 oz (91.9 kg)     GEN: Patient is in no acute distress HEENT: Normal NECK: No JVD; No carotid bruits LYMPHATICS: No lymphadenopathy CARDIAC: Hear sounds regular, 2/6 systolic murmur at the apex. RESPIRATORY:  Clear to auscultation without rales, wheezing or rhonchi  ABDOMEN: Soft, non-tender, non-distended MUSCULOSKELETAL: Bilateral pedal edema, right greater than left. No deformity  SKIN: Warm and dry NEUROLOGIC:  Alert and oriented x 3 PSYCHIATRIC:  Normal affect   Signed, Jenean Lindau, MD  07/26/2017 4:24 PM    [facility]

## 2017-07-27 ENCOUNTER — Telehealth: Payer: Self-pay

## 2017-07-27 NOTE — Telephone Encounter (Signed)
Informed of appointment at [facility]; will call Caryl Pina tomorrow to inform as well per patient request.

## 2017-07-28 ENCOUNTER — Telehealth: Payer: Self-pay

## 2017-07-28 DIAGNOSIS — J441 Chronic obstructive pulmonary disease with (acute) exacerbation: Secondary | ICD-10-CM | POA: Diagnosis not present

## 2017-07-28 DIAGNOSIS — J449 Chronic obstructive pulmonary disease, unspecified: Secondary | ICD-10-CM | POA: Diagnosis not present

## 2017-07-28 DIAGNOSIS — J181 Lobar pneumonia, unspecified organism: Secondary | ICD-10-CM | POA: Diagnosis not present

## 2017-07-28 DIAGNOSIS — I1310 Hypertensive heart and chronic kidney disease without heart failure, with stage 1 through stage 4 chronic kidney disease, or unspecified chronic kidney disease: Secondary | ICD-10-CM | POA: Diagnosis not present

## 2017-07-28 DIAGNOSIS — E1165 Type 2 diabetes mellitus with hyperglycemia: Secondary | ICD-10-CM | POA: Diagnosis not present

## 2017-07-28 DIAGNOSIS — E1122 Type 2 diabetes mellitus with diabetic chronic kidney disease: Secondary | ICD-10-CM | POA: Diagnosis not present

## 2017-07-28 DIAGNOSIS — N183 Chronic kidney disease, stage 3 (moderate): Secondary | ICD-10-CM | POA: Diagnosis not present

## 2017-07-28 DIAGNOSIS — N3281 Overactive bladder: Secondary | ICD-10-CM | POA: Diagnosis not present

## 2017-07-28 DIAGNOSIS — I5032 Chronic diastolic (congestive) heart failure: Secondary | ICD-10-CM | POA: Diagnosis not present

## 2017-07-28 DIAGNOSIS — R5383 Other fatigue: Secondary | ICD-10-CM | POA: Diagnosis not present

## 2017-07-28 DIAGNOSIS — M109 Gout, unspecified: Secondary | ICD-10-CM | POA: Diagnosis not present

## 2017-07-28 DIAGNOSIS — J440 Chronic obstructive pulmonary disease with acute lower respiratory infection: Secondary | ICD-10-CM | POA: Diagnosis not present

## 2017-07-28 NOTE — Telephone Encounter (Signed)
Appointment information and prep was given to Tamsen Roers per the patient's request. DVT study is not until the 21st; explained to [place] that this study could be moved up if the patient is willing; she will call back.

## 2017-08-01 ENCOUNTER — Telehealth: Payer: Self-pay

## 2017-08-01 DIAGNOSIS — E1165 Type 2 diabetes mellitus with hyperglycemia: Secondary | ICD-10-CM | POA: Diagnosis not present

## 2017-08-01 DIAGNOSIS — M109 Gout, unspecified: Secondary | ICD-10-CM | POA: Diagnosis not present

## 2017-08-01 DIAGNOSIS — J441 Chronic obstructive pulmonary disease with (acute) exacerbation: Secondary | ICD-10-CM | POA: Diagnosis not present

## 2017-08-01 DIAGNOSIS — I5032 Chronic diastolic (congestive) heart failure: Secondary | ICD-10-CM | POA: Diagnosis not present

## 2017-08-01 DIAGNOSIS — N183 Chronic kidney disease, stage 3 (moderate): Secondary | ICD-10-CM | POA: Diagnosis not present

## 2017-08-01 DIAGNOSIS — J440 Chronic obstructive pulmonary disease with acute lower respiratory infection: Secondary | ICD-10-CM | POA: Diagnosis not present

## 2017-08-01 DIAGNOSIS — E1122 Type 2 diabetes mellitus with diabetic chronic kidney disease: Secondary | ICD-10-CM | POA: Diagnosis not present

## 2017-08-01 DIAGNOSIS — N3281 Overactive bladder: Secondary | ICD-10-CM | POA: Diagnosis not present

## 2017-08-01 DIAGNOSIS — J181 Lobar pneumonia, unspecified organism: Secondary | ICD-10-CM | POA: Diagnosis not present

## 2017-08-01 DIAGNOSIS — I1310 Hypertensive heart and chronic kidney disease without heart failure, with stage 1 through stage 4 chronic kidney disease, or unspecified chronic kidney disease: Secondary | ICD-10-CM | POA: Diagnosis not present

## 2017-08-01 NOTE — Telephone Encounter (Signed)
Jan from [facility] called stating that the patient had a previous echo this month done while he was in the hospital. Echo was cancelled. Patient was called to inform of the echo cancellation. Patient was also scheduled for a lexi today; however, when the patient was called he stated he could not make it as he was waiting for his nurse to come at 10:30 am. The patient was given the number to call and reschedule his lexi at [facility] to better accomodate his schedule. Patient was agreeable.

## 2017-08-02 DIAGNOSIS — J181 Lobar pneumonia, unspecified organism: Secondary | ICD-10-CM | POA: Diagnosis not present

## 2017-08-03 DIAGNOSIS — I708 Atherosclerosis of other arteries: Secondary | ICD-10-CM | POA: Diagnosis not present

## 2017-08-03 DIAGNOSIS — J181 Lobar pneumonia, unspecified organism: Secondary | ICD-10-CM | POA: Diagnosis not present

## 2017-08-03 DIAGNOSIS — I70203 Unspecified atherosclerosis of native arteries of extremities, bilateral legs: Secondary | ICD-10-CM | POA: Diagnosis not present

## 2017-08-03 DIAGNOSIS — R0602 Shortness of breath: Secondary | ICD-10-CM | POA: Diagnosis not present

## 2017-08-03 DIAGNOSIS — R079 Chest pain, unspecified: Secondary | ICD-10-CM | POA: Diagnosis not present

## 2017-08-03 DIAGNOSIS — R600 Localized edema: Secondary | ICD-10-CM | POA: Diagnosis not present

## 2017-08-03 DIAGNOSIS — R0609 Other forms of dyspnea: Secondary | ICD-10-CM | POA: Diagnosis not present

## 2017-08-04 ENCOUNTER — Other Ambulatory Visit: Payer: Self-pay

## 2017-08-04 DIAGNOSIS — N183 Chronic kidney disease, stage 3 (moderate): Secondary | ICD-10-CM | POA: Diagnosis not present

## 2017-08-04 DIAGNOSIS — J440 Chronic obstructive pulmonary disease with acute lower respiratory infection: Secondary | ICD-10-CM | POA: Diagnosis not present

## 2017-08-04 DIAGNOSIS — N3281 Overactive bladder: Secondary | ICD-10-CM | POA: Diagnosis not present

## 2017-08-04 DIAGNOSIS — J181 Lobar pneumonia, unspecified organism: Secondary | ICD-10-CM | POA: Diagnosis not present

## 2017-08-04 DIAGNOSIS — E1122 Type 2 diabetes mellitus with diabetic chronic kidney disease: Secondary | ICD-10-CM | POA: Diagnosis not present

## 2017-08-04 DIAGNOSIS — E1165 Type 2 diabetes mellitus with hyperglycemia: Secondary | ICD-10-CM | POA: Diagnosis not present

## 2017-08-04 DIAGNOSIS — R0609 Other forms of dyspnea: Secondary | ICD-10-CM

## 2017-08-04 DIAGNOSIS — R69 Illness, unspecified: Secondary | ICD-10-CM | POA: Diagnosis not present

## 2017-08-04 DIAGNOSIS — I1310 Hypertensive heart and chronic kidney disease without heart failure, with stage 1 through stage 4 chronic kidney disease, or unspecified chronic kidney disease: Secondary | ICD-10-CM | POA: Diagnosis not present

## 2017-08-04 DIAGNOSIS — R600 Localized edema: Secondary | ICD-10-CM

## 2017-08-04 DIAGNOSIS — I5032 Chronic diastolic (congestive) heart failure: Secondary | ICD-10-CM | POA: Diagnosis not present

## 2017-08-04 DIAGNOSIS — M109 Gout, unspecified: Secondary | ICD-10-CM | POA: Diagnosis not present

## 2017-08-04 DIAGNOSIS — J441 Chronic obstructive pulmonary disease with (acute) exacerbation: Secondary | ICD-10-CM | POA: Diagnosis not present

## 2017-08-08 DIAGNOSIS — J441 Chronic obstructive pulmonary disease with (acute) exacerbation: Secondary | ICD-10-CM | POA: Diagnosis not present

## 2017-08-08 DIAGNOSIS — E1122 Type 2 diabetes mellitus with diabetic chronic kidney disease: Secondary | ICD-10-CM | POA: Diagnosis not present

## 2017-08-08 DIAGNOSIS — J440 Chronic obstructive pulmonary disease with acute lower respiratory infection: Secondary | ICD-10-CM | POA: Diagnosis not present

## 2017-08-08 DIAGNOSIS — N183 Chronic kidney disease, stage 3 (moderate): Secondary | ICD-10-CM | POA: Diagnosis not present

## 2017-08-08 DIAGNOSIS — I1310 Hypertensive heart and chronic kidney disease without heart failure, with stage 1 through stage 4 chronic kidney disease, or unspecified chronic kidney disease: Secondary | ICD-10-CM | POA: Diagnosis not present

## 2017-08-08 DIAGNOSIS — I5032 Chronic diastolic (congestive) heart failure: Secondary | ICD-10-CM | POA: Diagnosis not present

## 2017-08-08 DIAGNOSIS — E1165 Type 2 diabetes mellitus with hyperglycemia: Secondary | ICD-10-CM | POA: Diagnosis not present

## 2017-08-08 DIAGNOSIS — J181 Lobar pneumonia, unspecified organism: Secondary | ICD-10-CM | POA: Diagnosis not present

## 2017-08-08 DIAGNOSIS — N3281 Overactive bladder: Secondary | ICD-10-CM | POA: Diagnosis not present

## 2017-08-08 DIAGNOSIS — M109 Gout, unspecified: Secondary | ICD-10-CM | POA: Diagnosis not present

## 2017-08-14 DIAGNOSIS — R69 Illness, unspecified: Secondary | ICD-10-CM | POA: Diagnosis not present

## 2017-08-16 DIAGNOSIS — I1310 Hypertensive heart and chronic kidney disease without heart failure, with stage 1 through stage 4 chronic kidney disease, or unspecified chronic kidney disease: Secondary | ICD-10-CM | POA: Diagnosis not present

## 2017-08-16 DIAGNOSIS — I5032 Chronic diastolic (congestive) heart failure: Secondary | ICD-10-CM | POA: Diagnosis not present

## 2017-08-16 DIAGNOSIS — M109 Gout, unspecified: Secondary | ICD-10-CM | POA: Diagnosis not present

## 2017-08-16 DIAGNOSIS — E1165 Type 2 diabetes mellitus with hyperglycemia: Secondary | ICD-10-CM | POA: Diagnosis not present

## 2017-08-16 DIAGNOSIS — N183 Chronic kidney disease, stage 3 (moderate): Secondary | ICD-10-CM | POA: Diagnosis not present

## 2017-08-16 DIAGNOSIS — N3281 Overactive bladder: Secondary | ICD-10-CM | POA: Diagnosis not present

## 2017-08-16 DIAGNOSIS — Z9981 Dependence on supplemental oxygen: Secondary | ICD-10-CM | POA: Diagnosis not present

## 2017-08-16 DIAGNOSIS — Z6830 Body mass index (BMI) 30.0-30.9, adult: Secondary | ICD-10-CM | POA: Diagnosis not present

## 2017-08-16 DIAGNOSIS — E669 Obesity, unspecified: Secondary | ICD-10-CM | POA: Diagnosis not present

## 2017-08-16 DIAGNOSIS — E1122 Type 2 diabetes mellitus with diabetic chronic kidney disease: Secondary | ICD-10-CM | POA: Diagnosis not present

## 2017-08-18 DIAGNOSIS — J449 Chronic obstructive pulmonary disease, unspecified: Secondary | ICD-10-CM | POA: Diagnosis not present

## 2017-08-21 DIAGNOSIS — E119 Type 2 diabetes mellitus without complications: Secondary | ICD-10-CM | POA: Diagnosis not present

## 2017-08-21 DIAGNOSIS — I10 Essential (primary) hypertension: Secondary | ICD-10-CM | POA: Diagnosis not present

## 2017-08-21 DIAGNOSIS — I700 Atherosclerosis of aorta: Secondary | ICD-10-CM | POA: Diagnosis not present

## 2017-08-21 DIAGNOSIS — Z9189 Other specified personal risk factors, not elsewhere classified: Secondary | ICD-10-CM | POA: Diagnosis not present

## 2017-08-21 DIAGNOSIS — I5030 Unspecified diastolic (congestive) heart failure: Secondary | ICD-10-CM | POA: Diagnosis not present

## 2017-08-21 DIAGNOSIS — J189 Pneumonia, unspecified organism: Secondary | ICD-10-CM | POA: Diagnosis not present

## 2017-08-21 DIAGNOSIS — J181 Lobar pneumonia, unspecified organism: Secondary | ICD-10-CM | POA: Diagnosis not present

## 2017-08-23 DIAGNOSIS — I1310 Hypertensive heart and chronic kidney disease without heart failure, with stage 1 through stage 4 chronic kidney disease, or unspecified chronic kidney disease: Secondary | ICD-10-CM | POA: Diagnosis not present

## 2017-08-23 DIAGNOSIS — Z9981 Dependence on supplemental oxygen: Secondary | ICD-10-CM | POA: Diagnosis not present

## 2017-08-23 DIAGNOSIS — M109 Gout, unspecified: Secondary | ICD-10-CM | POA: Diagnosis not present

## 2017-08-23 DIAGNOSIS — I5032 Chronic diastolic (congestive) heart failure: Secondary | ICD-10-CM | POA: Diagnosis not present

## 2017-08-23 DIAGNOSIS — E1165 Type 2 diabetes mellitus with hyperglycemia: Secondary | ICD-10-CM | POA: Diagnosis not present

## 2017-08-23 DIAGNOSIS — E1122 Type 2 diabetes mellitus with diabetic chronic kidney disease: Secondary | ICD-10-CM | POA: Diagnosis not present

## 2017-08-23 DIAGNOSIS — Z6830 Body mass index (BMI) 30.0-30.9, adult: Secondary | ICD-10-CM | POA: Diagnosis not present

## 2017-08-23 DIAGNOSIS — N183 Chronic kidney disease, stage 3 (moderate): Secondary | ICD-10-CM | POA: Diagnosis not present

## 2017-08-23 DIAGNOSIS — E669 Obesity, unspecified: Secondary | ICD-10-CM | POA: Diagnosis not present

## 2017-08-23 DIAGNOSIS — N3281 Overactive bladder: Secondary | ICD-10-CM | POA: Diagnosis not present

## 2017-08-29 DIAGNOSIS — M109 Gout, unspecified: Secondary | ICD-10-CM | POA: Diagnosis not present

## 2017-08-29 DIAGNOSIS — Z9981 Dependence on supplemental oxygen: Secondary | ICD-10-CM | POA: Diagnosis not present

## 2017-08-29 DIAGNOSIS — E1122 Type 2 diabetes mellitus with diabetic chronic kidney disease: Secondary | ICD-10-CM | POA: Diagnosis not present

## 2017-08-29 DIAGNOSIS — I1310 Hypertensive heart and chronic kidney disease without heart failure, with stage 1 through stage 4 chronic kidney disease, or unspecified chronic kidney disease: Secondary | ICD-10-CM | POA: Diagnosis not present

## 2017-08-29 DIAGNOSIS — I5032 Chronic diastolic (congestive) heart failure: Secondary | ICD-10-CM | POA: Diagnosis not present

## 2017-08-29 DIAGNOSIS — E1165 Type 2 diabetes mellitus with hyperglycemia: Secondary | ICD-10-CM | POA: Diagnosis not present

## 2017-08-29 DIAGNOSIS — N183 Chronic kidney disease, stage 3 (moderate): Secondary | ICD-10-CM | POA: Diagnosis not present

## 2017-08-29 DIAGNOSIS — E669 Obesity, unspecified: Secondary | ICD-10-CM | POA: Diagnosis not present

## 2017-08-29 DIAGNOSIS — N3281 Overactive bladder: Secondary | ICD-10-CM | POA: Diagnosis not present

## 2017-08-29 DIAGNOSIS — Z6830 Body mass index (BMI) 30.0-30.9, adult: Secondary | ICD-10-CM | POA: Diagnosis not present

## 2017-08-31 DIAGNOSIS — E785 Hyperlipidemia, unspecified: Secondary | ICD-10-CM | POA: Diagnosis not present

## 2017-08-31 DIAGNOSIS — Z125 Encounter for screening for malignant neoplasm of prostate: Secondary | ICD-10-CM | POA: Diagnosis not present

## 2017-08-31 DIAGNOSIS — Z1389 Encounter for screening for other disorder: Secondary | ICD-10-CM | POA: Diagnosis not present

## 2017-08-31 DIAGNOSIS — E669 Obesity, unspecified: Secondary | ICD-10-CM | POA: Diagnosis not present

## 2017-08-31 DIAGNOSIS — Z6831 Body mass index (BMI) 31.0-31.9, adult: Secondary | ICD-10-CM | POA: Diagnosis not present

## 2017-08-31 DIAGNOSIS — Z0000 Encounter for general adult medical examination without abnormal findings: Secondary | ICD-10-CM | POA: Diagnosis not present

## 2017-08-31 DIAGNOSIS — E1165 Type 2 diabetes mellitus with hyperglycemia: Secondary | ICD-10-CM | POA: Diagnosis not present

## 2017-08-31 DIAGNOSIS — I1310 Hypertensive heart and chronic kidney disease without heart failure, with stage 1 through stage 4 chronic kidney disease, or unspecified chronic kidney disease: Secondary | ICD-10-CM | POA: Diagnosis not present

## 2017-08-31 DIAGNOSIS — E1122 Type 2 diabetes mellitus with diabetic chronic kidney disease: Secondary | ICD-10-CM | POA: Diagnosis not present

## 2017-08-31 DIAGNOSIS — Z136 Encounter for screening for cardiovascular disorders: Secondary | ICD-10-CM | POA: Diagnosis not present

## 2017-08-31 DIAGNOSIS — I5030 Unspecified diastolic (congestive) heart failure: Secondary | ICD-10-CM | POA: Diagnosis not present

## 2017-08-31 DIAGNOSIS — Z789 Other specified health status: Secondary | ICD-10-CM | POA: Diagnosis not present

## 2017-08-31 DIAGNOSIS — Z1211 Encounter for screening for malignant neoplasm of colon: Secondary | ICD-10-CM | POA: Diagnosis not present

## 2017-09-05 DIAGNOSIS — G4733 Obstructive sleep apnea (adult) (pediatric): Secondary | ICD-10-CM | POA: Diagnosis not present

## 2017-09-05 DIAGNOSIS — J449 Chronic obstructive pulmonary disease, unspecified: Secondary | ICD-10-CM | POA: Diagnosis not present

## 2017-09-05 DIAGNOSIS — R5383 Other fatigue: Secondary | ICD-10-CM | POA: Diagnosis not present

## 2017-09-06 DIAGNOSIS — J189 Pneumonia, unspecified organism: Secondary | ICD-10-CM | POA: Diagnosis not present

## 2017-09-06 DIAGNOSIS — J90 Pleural effusion, not elsewhere classified: Secondary | ICD-10-CM | POA: Diagnosis not present

## 2017-09-06 DIAGNOSIS — R0902 Hypoxemia: Secondary | ICD-10-CM | POA: Diagnosis not present

## 2017-09-06 DIAGNOSIS — R0602 Shortness of breath: Secondary | ICD-10-CM | POA: Diagnosis not present

## 2017-09-06 DIAGNOSIS — N182 Chronic kidney disease, stage 2 (mild): Secondary | ICD-10-CM | POA: Diagnosis not present

## 2017-09-06 DIAGNOSIS — J449 Chronic obstructive pulmonary disease, unspecified: Secondary | ICD-10-CM | POA: Diagnosis not present

## 2017-09-06 DIAGNOSIS — N3281 Overactive bladder: Secondary | ICD-10-CM | POA: Diagnosis not present

## 2017-09-06 DIAGNOSIS — I10 Essential (primary) hypertension: Secondary | ICD-10-CM | POA: Diagnosis not present

## 2017-09-06 DIAGNOSIS — I5032 Chronic diastolic (congestive) heart failure: Secondary | ICD-10-CM | POA: Diagnosis not present

## 2017-09-06 DIAGNOSIS — N183 Chronic kidney disease, stage 3 (moderate): Secondary | ICD-10-CM | POA: Diagnosis not present

## 2017-09-06 DIAGNOSIS — R079 Chest pain, unspecified: Secondary | ICD-10-CM | POA: Diagnosis not present

## 2017-09-06 DIAGNOSIS — J441 Chronic obstructive pulmonary disease with (acute) exacerbation: Secondary | ICD-10-CM | POA: Diagnosis not present

## 2017-09-06 DIAGNOSIS — Z7984 Long term (current) use of oral hypoglycemic drugs: Secondary | ICD-10-CM | POA: Diagnosis not present

## 2017-09-06 DIAGNOSIS — E1122 Type 2 diabetes mellitus with diabetic chronic kidney disease: Secondary | ICD-10-CM | POA: Diagnosis not present

## 2017-09-06 DIAGNOSIS — J9601 Acute respiratory failure with hypoxia: Secondary | ICD-10-CM

## 2017-09-06 DIAGNOSIS — I509 Heart failure, unspecified: Secondary | ICD-10-CM | POA: Diagnosis not present

## 2017-09-06 DIAGNOSIS — Z9981 Dependence on supplemental oxygen: Secondary | ICD-10-CM | POA: Diagnosis not present

## 2017-09-06 DIAGNOSIS — I1310 Hypertensive heart and chronic kidney disease without heart failure, with stage 1 through stage 4 chronic kidney disease, or unspecified chronic kidney disease: Secondary | ICD-10-CM | POA: Diagnosis not present

## 2017-09-06 DIAGNOSIS — R739 Hyperglycemia, unspecified: Secondary | ICD-10-CM | POA: Diagnosis not present

## 2017-09-06 DIAGNOSIS — I130 Hypertensive heart and chronic kidney disease with heart failure and stage 1 through stage 4 chronic kidney disease, or unspecified chronic kidney disease: Secondary | ICD-10-CM | POA: Diagnosis not present

## 2017-09-06 DIAGNOSIS — Z6830 Body mass index (BMI) 30.0-30.9, adult: Secondary | ICD-10-CM | POA: Diagnosis not present

## 2017-09-06 DIAGNOSIS — J181 Lobar pneumonia, unspecified organism: Secondary | ICD-10-CM | POA: Diagnosis not present

## 2017-09-06 DIAGNOSIS — M109 Gout, unspecified: Secondary | ICD-10-CM | POA: Diagnosis not present

## 2017-09-06 DIAGNOSIS — Z794 Long term (current) use of insulin: Secondary | ICD-10-CM | POA: Diagnosis not present

## 2017-09-06 DIAGNOSIS — E1165 Type 2 diabetes mellitus with hyperglycemia: Secondary | ICD-10-CM | POA: Diagnosis not present

## 2017-09-06 DIAGNOSIS — E669 Obesity, unspecified: Secondary | ICD-10-CM | POA: Diagnosis not present

## 2017-09-06 DIAGNOSIS — N179 Acute kidney failure, unspecified: Secondary | ICD-10-CM | POA: Diagnosis not present

## 2017-09-07 DIAGNOSIS — J441 Chronic obstructive pulmonary disease with (acute) exacerbation: Secondary | ICD-10-CM | POA: Diagnosis not present

## 2017-09-07 DIAGNOSIS — R0902 Hypoxemia: Secondary | ICD-10-CM | POA: Diagnosis not present

## 2017-09-08 DIAGNOSIS — R0902 Hypoxemia: Secondary | ICD-10-CM | POA: Diagnosis not present

## 2017-09-08 DIAGNOSIS — J441 Chronic obstructive pulmonary disease with (acute) exacerbation: Secondary | ICD-10-CM | POA: Diagnosis not present

## 2017-09-10 DIAGNOSIS — I1310 Hypertensive heart and chronic kidney disease without heart failure, with stage 1 through stage 4 chronic kidney disease, or unspecified chronic kidney disease: Secondary | ICD-10-CM | POA: Diagnosis not present

## 2017-09-10 DIAGNOSIS — I5032 Chronic diastolic (congestive) heart failure: Secondary | ICD-10-CM | POA: Diagnosis not present

## 2017-09-10 DIAGNOSIS — Z9981 Dependence on supplemental oxygen: Secondary | ICD-10-CM | POA: Diagnosis not present

## 2017-09-10 DIAGNOSIS — E1122 Type 2 diabetes mellitus with diabetic chronic kidney disease: Secondary | ICD-10-CM | POA: Diagnosis not present

## 2017-09-10 DIAGNOSIS — Z6830 Body mass index (BMI) 30.0-30.9, adult: Secondary | ICD-10-CM | POA: Diagnosis not present

## 2017-09-10 DIAGNOSIS — E669 Obesity, unspecified: Secondary | ICD-10-CM | POA: Diagnosis not present

## 2017-09-10 DIAGNOSIS — N183 Chronic kidney disease, stage 3 (moderate): Secondary | ICD-10-CM | POA: Diagnosis not present

## 2017-09-10 DIAGNOSIS — E1165 Type 2 diabetes mellitus with hyperglycemia: Secondary | ICD-10-CM | POA: Diagnosis not present

## 2017-09-10 DIAGNOSIS — M109 Gout, unspecified: Secondary | ICD-10-CM | POA: Diagnosis not present

## 2017-09-10 DIAGNOSIS — N3281 Overactive bladder: Secondary | ICD-10-CM | POA: Diagnosis not present

## 2017-09-11 DIAGNOSIS — R0902 Hypoxemia: Secondary | ICD-10-CM | POA: Diagnosis not present

## 2017-09-11 DIAGNOSIS — M109 Gout, unspecified: Secondary | ICD-10-CM | POA: Diagnosis not present

## 2017-09-11 DIAGNOSIS — E669 Obesity, unspecified: Secondary | ICD-10-CM | POA: Diagnosis not present

## 2017-09-11 DIAGNOSIS — I1310 Hypertensive heart and chronic kidney disease without heart failure, with stage 1 through stage 4 chronic kidney disease, or unspecified chronic kidney disease: Secondary | ICD-10-CM | POA: Diagnosis not present

## 2017-09-11 DIAGNOSIS — N3281 Overactive bladder: Secondary | ICD-10-CM | POA: Diagnosis not present

## 2017-09-11 DIAGNOSIS — N183 Chronic kidney disease, stage 3 (moderate): Secondary | ICD-10-CM | POA: Diagnosis not present

## 2017-09-11 DIAGNOSIS — I5030 Unspecified diastolic (congestive) heart failure: Secondary | ICD-10-CM | POA: Diagnosis not present

## 2017-09-11 DIAGNOSIS — Z9189 Other specified personal risk factors, not elsewhere classified: Secondary | ICD-10-CM | POA: Diagnosis not present

## 2017-09-11 DIAGNOSIS — E1122 Type 2 diabetes mellitus with diabetic chronic kidney disease: Secondary | ICD-10-CM | POA: Diagnosis not present

## 2017-09-11 DIAGNOSIS — J449 Chronic obstructive pulmonary disease, unspecified: Secondary | ICD-10-CM | POA: Diagnosis not present

## 2017-09-11 DIAGNOSIS — I5032 Chronic diastolic (congestive) heart failure: Secondary | ICD-10-CM | POA: Diagnosis not present

## 2017-09-11 DIAGNOSIS — E1165 Type 2 diabetes mellitus with hyperglycemia: Secondary | ICD-10-CM | POA: Diagnosis not present

## 2017-09-11 DIAGNOSIS — Z9981 Dependence on supplemental oxygen: Secondary | ICD-10-CM | POA: Diagnosis not present

## 2017-09-11 DIAGNOSIS — Z6830 Body mass index (BMI) 30.0-30.9, adult: Secondary | ICD-10-CM | POA: Diagnosis not present

## 2017-09-12 DIAGNOSIS — J449 Chronic obstructive pulmonary disease, unspecified: Secondary | ICD-10-CM | POA: Diagnosis not present

## 2017-09-12 DIAGNOSIS — I5030 Unspecified diastolic (congestive) heart failure: Secondary | ICD-10-CM | POA: Diagnosis not present

## 2017-09-13 DIAGNOSIS — I5032 Chronic diastolic (congestive) heart failure: Secondary | ICD-10-CM | POA: Diagnosis not present

## 2017-09-13 DIAGNOSIS — Z9981 Dependence on supplemental oxygen: Secondary | ICD-10-CM | POA: Diagnosis not present

## 2017-09-13 DIAGNOSIS — E1122 Type 2 diabetes mellitus with diabetic chronic kidney disease: Secondary | ICD-10-CM | POA: Diagnosis not present

## 2017-09-13 DIAGNOSIS — E669 Obesity, unspecified: Secondary | ICD-10-CM | POA: Diagnosis not present

## 2017-09-13 DIAGNOSIS — J449 Chronic obstructive pulmonary disease, unspecified: Secondary | ICD-10-CM | POA: Diagnosis not present

## 2017-09-13 DIAGNOSIS — E1165 Type 2 diabetes mellitus with hyperglycemia: Secondary | ICD-10-CM | POA: Diagnosis not present

## 2017-09-13 DIAGNOSIS — M109 Gout, unspecified: Secondary | ICD-10-CM | POA: Diagnosis not present

## 2017-09-13 DIAGNOSIS — N183 Chronic kidney disease, stage 3 (moderate): Secondary | ICD-10-CM | POA: Diagnosis not present

## 2017-09-13 DIAGNOSIS — E875 Hyperkalemia: Secondary | ICD-10-CM | POA: Diagnosis not present

## 2017-09-13 DIAGNOSIS — N3281 Overactive bladder: Secondary | ICD-10-CM | POA: Diagnosis not present

## 2017-09-13 DIAGNOSIS — Z6830 Body mass index (BMI) 30.0-30.9, adult: Secondary | ICD-10-CM | POA: Diagnosis not present

## 2017-09-13 DIAGNOSIS — I1310 Hypertensive heart and chronic kidney disease without heart failure, with stage 1 through stage 4 chronic kidney disease, or unspecified chronic kidney disease: Secondary | ICD-10-CM | POA: Diagnosis not present

## 2017-09-15 ENCOUNTER — Ambulatory Visit: Payer: Medicare HMO | Admitting: Cardiology

## 2017-09-15 ENCOUNTER — Encounter: Payer: Self-pay | Admitting: Cardiology

## 2017-09-15 VITALS — BP 142/70 | HR 66 | Ht 69.0 in | Wt 214.1 lb

## 2017-09-15 DIAGNOSIS — R600 Localized edema: Secondary | ICD-10-CM

## 2017-09-15 DIAGNOSIS — I714 Abdominal aortic aneurysm, without rupture, unspecified: Secondary | ICD-10-CM

## 2017-09-15 DIAGNOSIS — I7090 Unspecified atherosclerosis: Secondary | ICD-10-CM

## 2017-09-15 DIAGNOSIS — R69 Illness, unspecified: Secondary | ICD-10-CM | POA: Diagnosis not present

## 2017-09-15 DIAGNOSIS — R0609 Other forms of dyspnea: Secondary | ICD-10-CM

## 2017-09-15 NOTE — Patient Instructions (Signed)
Medication Instructions:  Your physician recommends that you continue on your current medications as directed. Please refer to the Current Medication list given to you today.   Labwork: None  Testing/Procedures: None  Follow-Up: Your physician recommends that you schedule a follow-up appointment in:6 Months   Any Other Special Instructions Will Be Listed Below (If Applicable).     If you need a refill on your cardiac medications before your next appointment, please call your pharmacy.   

## 2017-09-15 NOTE — Progress Notes (Signed)
Cardiology Office Note:    Date:  09/15/2017   ID:  [name], DOB 1933/07/14, MRN [id number]  PCP:  Care, [place] [facility]  Cardiologist:  Jenean Lindau, MD   Referring MD: Care, [place]:    1. Abdominal aortic aneurysm (AAA) without rupture ([place])   2. Atherosclerotic vascular disease   3. DOE (dyspnea on exertion)   4. Pedal edema    PLAN:    In order of problems listed above:  1. Secondary prevention stressed with the patient.  Importance of compliance with diet and medications stressed and he vocalized understanding.  His blood pressure stable I have asked him to keep a track of it especially now that his diuretic is discontinued.  I also told him that if he experiences significant pedal edema he needs to get back in touch with his primary care provider about the possibility of reducing diuretic dose and reinstituting it.  He vocalized understanding. 2. He needs to be on statins and he is taking it religiously and this will be followed by his primary care physician.  He also has been scheduled for blood work since his diuretic has been stopped in the next few days.  Lipids also will be followed by his primary care physician. 3. I congratulated him about quitting smoking. 4. Patient will be seen in follow-up appointment in 6 months or earlier if the patient has any concerns 5. I told him never to go back to smoking and he promises to do so.   Medication Adjustments/Labs and Tests Ordered: Current medicines are reviewed at length with the patient today.  Concerns regarding medicines are outlined above.  No orders of the defined types were placed in this encounter.  No orders of the defined types were placed in this encounter.    Chief Complaint  Patient presents with  . Follow-up  . AAA  . Shortness of Breath     History of Present Illness:    Ethan Walters is a 84 y.o. male.  The patient has past medical history of COPD,  chronic smoking which she quit recently.  The patient has history of essential hypertension, abdominal aortic aneurysm, diabetes mellitus and now diagnosed to have significant renal insufficiency.  His primary care provider stopped his diuretic for the same reason mentioning that his kidney problem is getting worse.  Patient denies any problems at this time.  He leads a sedentary lifestyle.  At the time of my evaluation, the patient is alert awake oriented and in no distress.  No chest pain orthopnea or PND.  His daughter-in-law accompanies him for this visit.  Past Medical History:  Diagnosis Date  . AAA (abdominal aortic aneurysm) ([place])   . Diabetes mellitus without complication ([place])   . Hypertension     Past Surgical History:  Procedure Laterality Date  . APPENDECTOMY  09/17/2015  . HERNIA REPAIR      Current Medications: Current Meds  Medication Sig  . albuterol (PROAIR HFA) 108 (90 Base) MCG/ACT inhaler   . amLODipine (NORVASC) 10 MG tablet   . bisoprolol (ZEBETA) 10 MG tablet   . hydrALAZINE (APRESOLINE) 50 MG tablet   . simvastatin (ZOCOR) 20 MG tablet Take 20 mg by mouth.   . TRADJENTA 5 MG TABS tablet      Allergies:   Neosporin [neomycin-bacitracin zn-polymyx]   Social History   Socioeconomic History  . Marital status: Married    Spouse name: Not on  file  . Number of children: Not on file  . Years of education: Not on file  . Highest education level: Not on file  Occupational History  . Not on file  Social Needs  . Financial resource strain: Not on file  . Food insecurity:    Worry: Not on file    Inability: Not on file  . Transportation needs:    Medical: Not on file    Non-medical: Not on file  Tobacco Use  . Smoking status: Never Smoker  . Smokeless tobacco: Never Used  Substance and Sexual Activity  . Alcohol use: No    Alcohol/week: 0.0 oz  . Drug use: No  . Sexual activity: Not on file  Lifestyle  . Physical activity:    Days per week: Not on  file    Minutes per session: Not on file  . Stress: Not on file  Relationships  . Social connections:    Talks on phone: Not on file    Gets together: Not on file    Attends religious service: Not on file    Active member of club or organization: Not on file    Attends meetings of clubs or organizations: Not on file    Relationship status: Not on file  Other Topics Concern  . Not on file  Social History Narrative  . Not on file     Family History: The patient's family history is not on file.  ROS:   Please see the history of present illness.    All other systems reviewed and are negative.  EKGs/Labs/Other Studies Reviewed:    The following studies were reviewed today: I reviewed records from previous visits.  Echocardiogram revealed previous clear preserved systolic function and this was done at [facility]: No results found for requested labs within last 8760 hours.  Recent Lipid Panel No results found for: CHOL, TRIG, HDL, CHOLHDL, VLDL, LDLCALC, LDLDIRECT  Physical Exam:    VS:  BP (!) 142/70 (BP Location: Right Arm, Patient Position: Sitting, Cuff Size: Normal)   Pulse 66   Ht 5\' 9"  (1.753 m)   Wt 214 lb 1.9 oz (97.1 kg)   SpO2 98%   BMI 31.62 kg/m     Wt Readings from Last 3 Encounters:  09/15/17 214 lb 1.9 oz (97.1 kg)  07/26/17 215 lb (97.5 kg)  05/12/17 212 lb 9.6 oz (96.4 kg)     GEN: Patient is in no acute distress HEENT: Normal NECK: No JVD; No carotid bruits LYMPHATICS: No lymphadenopathy CARDIAC: Hear sounds regular, 2/6 systolic murmur at the apex. RESPIRATORY:  Clear to auscultation without rales, wheezing or rhonchi  ABDOMEN: Soft, non-tender, non-distended MUSCULOSKELETAL:  No edema; No deformity  SKIN: Warm and dry NEUROLOGIC:  Alert and oriented x 3 PSYCHIATRIC:  Normal affect   Signed, Jenean Lindau, MD  09/15/2017 9:42 AM    [place]

## 2017-09-18 DIAGNOSIS — J449 Chronic obstructive pulmonary disease, unspecified: Secondary | ICD-10-CM | POA: Diagnosis not present

## 2017-09-19 DIAGNOSIS — E669 Obesity, unspecified: Secondary | ICD-10-CM | POA: Diagnosis not present

## 2017-09-19 DIAGNOSIS — I5032 Chronic diastolic (congestive) heart failure: Secondary | ICD-10-CM | POA: Diagnosis not present

## 2017-09-19 DIAGNOSIS — E1122 Type 2 diabetes mellitus with diabetic chronic kidney disease: Secondary | ICD-10-CM | POA: Diagnosis not present

## 2017-09-19 DIAGNOSIS — M109 Gout, unspecified: Secondary | ICD-10-CM | POA: Diagnosis not present

## 2017-09-19 DIAGNOSIS — N183 Chronic kidney disease, stage 3 (moderate): Secondary | ICD-10-CM | POA: Diagnosis not present

## 2017-09-19 DIAGNOSIS — I1310 Hypertensive heart and chronic kidney disease without heart failure, with stage 1 through stage 4 chronic kidney disease, or unspecified chronic kidney disease: Secondary | ICD-10-CM | POA: Diagnosis not present

## 2017-09-19 DIAGNOSIS — Z9981 Dependence on supplemental oxygen: Secondary | ICD-10-CM | POA: Diagnosis not present

## 2017-09-19 DIAGNOSIS — N3281 Overactive bladder: Secondary | ICD-10-CM | POA: Diagnosis not present

## 2017-09-19 DIAGNOSIS — Z6830 Body mass index (BMI) 30.0-30.9, adult: Secondary | ICD-10-CM | POA: Diagnosis not present

## 2017-09-19 DIAGNOSIS — E1165 Type 2 diabetes mellitus with hyperglycemia: Secondary | ICD-10-CM | POA: Diagnosis not present

## 2017-09-25 DIAGNOSIS — N183 Chronic kidney disease, stage 3 (moderate): Secondary | ICD-10-CM | POA: Diagnosis not present

## 2017-09-25 DIAGNOSIS — E1165 Type 2 diabetes mellitus with hyperglycemia: Secondary | ICD-10-CM | POA: Diagnosis not present

## 2017-09-25 DIAGNOSIS — E669 Obesity, unspecified: Secondary | ICD-10-CM | POA: Diagnosis not present

## 2017-09-25 DIAGNOSIS — Z9981 Dependence on supplemental oxygen: Secondary | ICD-10-CM | POA: Diagnosis not present

## 2017-09-25 DIAGNOSIS — M109 Gout, unspecified: Secondary | ICD-10-CM | POA: Diagnosis not present

## 2017-09-25 DIAGNOSIS — N3281 Overactive bladder: Secondary | ICD-10-CM | POA: Diagnosis not present

## 2017-09-25 DIAGNOSIS — I5032 Chronic diastolic (congestive) heart failure: Secondary | ICD-10-CM | POA: Diagnosis not present

## 2017-09-25 DIAGNOSIS — Z6830 Body mass index (BMI) 30.0-30.9, adult: Secondary | ICD-10-CM | POA: Diagnosis not present

## 2017-09-25 DIAGNOSIS — E1122 Type 2 diabetes mellitus with diabetic chronic kidney disease: Secondary | ICD-10-CM | POA: Diagnosis not present

## 2017-09-25 DIAGNOSIS — I1310 Hypertensive heart and chronic kidney disease without heart failure, with stage 1 through stage 4 chronic kidney disease, or unspecified chronic kidney disease: Secondary | ICD-10-CM | POA: Diagnosis not present

## 2017-09-28 DIAGNOSIS — E875 Hyperkalemia: Secondary | ICD-10-CM | POA: Insufficient documentation

## 2017-09-28 DIAGNOSIS — E8770 Fluid overload, unspecified: Secondary | ICD-10-CM | POA: Insufficient documentation

## 2017-09-28 DIAGNOSIS — I129 Hypertensive chronic kidney disease with stage 1 through stage 4 chronic kidney disease, or unspecified chronic kidney disease: Secondary | ICD-10-CM | POA: Diagnosis not present

## 2017-09-28 DIAGNOSIS — N390 Urinary tract infection, site not specified: Secondary | ICD-10-CM | POA: Diagnosis not present

## 2017-09-28 DIAGNOSIS — R801 Persistent proteinuria, unspecified: Secondary | ICD-10-CM | POA: Diagnosis not present

## 2017-09-28 DIAGNOSIS — N183 Chronic kidney disease, stage 3 unspecified: Secondary | ICD-10-CM | POA: Insufficient documentation

## 2017-09-28 DIAGNOSIS — E1122 Type 2 diabetes mellitus with diabetic chronic kidney disease: Secondary | ICD-10-CM | POA: Diagnosis not present

## 2017-10-03 DIAGNOSIS — N183 Chronic kidney disease, stage 3 (moderate): Secondary | ICD-10-CM | POA: Diagnosis not present

## 2017-10-03 DIAGNOSIS — I1310 Hypertensive heart and chronic kidney disease without heart failure, with stage 1 through stage 4 chronic kidney disease, or unspecified chronic kidney disease: Secondary | ICD-10-CM | POA: Diagnosis not present

## 2017-10-03 DIAGNOSIS — N3281 Overactive bladder: Secondary | ICD-10-CM | POA: Diagnosis not present

## 2017-10-03 DIAGNOSIS — J449 Chronic obstructive pulmonary disease, unspecified: Secondary | ICD-10-CM | POA: Diagnosis not present

## 2017-10-03 DIAGNOSIS — Z6830 Body mass index (BMI) 30.0-30.9, adult: Secondary | ICD-10-CM | POA: Diagnosis not present

## 2017-10-03 DIAGNOSIS — E669 Obesity, unspecified: Secondary | ICD-10-CM | POA: Diagnosis not present

## 2017-10-03 DIAGNOSIS — M109 Gout, unspecified: Secondary | ICD-10-CM | POA: Diagnosis not present

## 2017-10-03 DIAGNOSIS — E1165 Type 2 diabetes mellitus with hyperglycemia: Secondary | ICD-10-CM | POA: Diagnosis not present

## 2017-10-03 DIAGNOSIS — E1122 Type 2 diabetes mellitus with diabetic chronic kidney disease: Secondary | ICD-10-CM | POA: Diagnosis not present

## 2017-10-03 DIAGNOSIS — I5032 Chronic diastolic (congestive) heart failure: Secondary | ICD-10-CM | POA: Diagnosis not present

## 2017-10-03 DIAGNOSIS — Z9981 Dependence on supplemental oxygen: Secondary | ICD-10-CM | POA: Diagnosis not present

## 2017-10-11 DIAGNOSIS — E669 Obesity, unspecified: Secondary | ICD-10-CM | POA: Diagnosis not present

## 2017-10-11 DIAGNOSIS — Z9981 Dependence on supplemental oxygen: Secondary | ICD-10-CM | POA: Diagnosis not present

## 2017-10-11 DIAGNOSIS — I5032 Chronic diastolic (congestive) heart failure: Secondary | ICD-10-CM | POA: Diagnosis not present

## 2017-10-11 DIAGNOSIS — M109 Gout, unspecified: Secondary | ICD-10-CM | POA: Diagnosis not present

## 2017-10-11 DIAGNOSIS — N3281 Overactive bladder: Secondary | ICD-10-CM | POA: Diagnosis not present

## 2017-10-11 DIAGNOSIS — E1165 Type 2 diabetes mellitus with hyperglycemia: Secondary | ICD-10-CM | POA: Diagnosis not present

## 2017-10-11 DIAGNOSIS — N183 Chronic kidney disease, stage 3 (moderate): Secondary | ICD-10-CM | POA: Diagnosis not present

## 2017-10-11 DIAGNOSIS — I1310 Hypertensive heart and chronic kidney disease without heart failure, with stage 1 through stage 4 chronic kidney disease, or unspecified chronic kidney disease: Secondary | ICD-10-CM | POA: Diagnosis not present

## 2017-10-11 DIAGNOSIS — E1122 Type 2 diabetes mellitus with diabetic chronic kidney disease: Secondary | ICD-10-CM | POA: Diagnosis not present

## 2017-10-11 DIAGNOSIS — Z6830 Body mass index (BMI) 30.0-30.9, adult: Secondary | ICD-10-CM | POA: Diagnosis not present

## 2017-10-16 DIAGNOSIS — E1122 Type 2 diabetes mellitus with diabetic chronic kidney disease: Secondary | ICD-10-CM | POA: Diagnosis not present

## 2017-10-16 DIAGNOSIS — E785 Hyperlipidemia, unspecified: Secondary | ICD-10-CM | POA: Diagnosis not present

## 2017-10-16 DIAGNOSIS — J449 Chronic obstructive pulmonary disease, unspecified: Secondary | ICD-10-CM | POA: Diagnosis not present

## 2017-10-16 DIAGNOSIS — I1310 Hypertensive heart and chronic kidney disease without heart failure, with stage 1 through stage 4 chronic kidney disease, or unspecified chronic kidney disease: Secondary | ICD-10-CM | POA: Diagnosis not present

## 2017-10-16 DIAGNOSIS — M109 Gout, unspecified: Secondary | ICD-10-CM | POA: Diagnosis not present

## 2017-10-18 DIAGNOSIS — J449 Chronic obstructive pulmonary disease, unspecified: Secondary | ICD-10-CM | POA: Diagnosis not present

## 2017-10-23 ENCOUNTER — Other Ambulatory Visit: Payer: Self-pay

## 2017-10-23 ENCOUNTER — Emergency Department (HOSPITAL_COMMUNITY)
Admission: EM | Admit: 2017-10-23 | Discharge: 2017-10-23 | Disposition: A | Discharge status: Home / Self Care | Payer: Medicare HMO | Attending: Emergency Medicine | Admitting: Emergency Medicine

## 2017-10-23 ENCOUNTER — Emergency Department (HOSPITAL_COMMUNITY): Payer: Medicare HMO

## 2017-10-23 ENCOUNTER — Encounter (HOSPITAL_COMMUNITY): Payer: Self-pay

## 2017-10-23 DIAGNOSIS — I10 Essential (primary) hypertension: Secondary | ICD-10-CM | POA: Diagnosis not present

## 2017-10-23 DIAGNOSIS — Y939 Activity, unspecified: Secondary | ICD-10-CM | POA: Diagnosis not present

## 2017-10-23 DIAGNOSIS — E119 Type 2 diabetes mellitus without complications: Secondary | ICD-10-CM | POA: Diagnosis not present

## 2017-10-23 DIAGNOSIS — Y929 Unspecified place or not applicable: Secondary | ICD-10-CM | POA: Insufficient documentation

## 2017-10-23 DIAGNOSIS — S0181XA Laceration without foreign body of other part of head, initial encounter: Secondary | ICD-10-CM | POA: Diagnosis not present

## 2017-10-23 DIAGNOSIS — Y999 Unspecified external cause status: Secondary | ICD-10-CM | POA: Insufficient documentation

## 2017-10-23 DIAGNOSIS — W01198A Fall on same level from slipping, tripping and stumbling with subsequent striking against other object, initial encounter: Secondary | ICD-10-CM | POA: Insufficient documentation

## 2017-10-23 DIAGNOSIS — R51 Headache: Secondary | ICD-10-CM | POA: Diagnosis not present

## 2017-10-23 DIAGNOSIS — S0101XA Laceration without foreign body of scalp, initial encounter: Secondary | ICD-10-CM | POA: Diagnosis not present

## 2017-10-23 DIAGNOSIS — S0990XA Unspecified injury of head, initial encounter: Secondary | ICD-10-CM

## 2017-10-23 DIAGNOSIS — W19XXXA Unspecified fall, initial encounter: Secondary | ICD-10-CM

## 2017-10-23 MED ORDER — TETANUS-DIPHTH-ACELL PERTUSSIS 5-2.5-18.5 LF-MCG/0.5 IM SUSP
0.5000 mL | Freq: Once | INTRAMUSCULAR | Status: AC
  Administered 2017-10-23: 0.5 mL via INTRAMUSCULAR
  Filled 2017-10-23: qty 0.5

## 2017-10-23 NOTE — ED Provider Notes (Signed)
[facility] Provider Note   CSN: [id number] Arrival date & time: 10/23/17  1305     History   Chief Complaint Chief Complaint  Patient presents with  . Head Laceration    HPI Ethan Walters is a 84 y.o. male with a PMHx of AAA, peripheral edema, DM2, and HTN, who presents to the ED with complaints of mechanical fall that occurred around 1230, approximately 1 hour prior to evaluation.  Patient states that he was getting up from a barstool when his foot slid underneath him and he fell back, hitting his head on the doorway molding behind him.  He denies any prodromal symptoms, did not feel lightheaded or dizzy prior to the fall.  He sustained a laceration to the posterior scalp, bleeding was controlled without any intervention.  No known alleviating or aggravating factors, no treatments were attempted prior to arrival aside from wrapping his head with a bandage.  He is unsure of his last tetanus shot.  He denies any pain anywhere, denies any headaches, vision changes, lightheadedness, myalgias, arthralgias, neck or back pain, chest pain, shortness of breath, abdominal pain, nausea, vomiting, numbness, tingling, focal weakness, or any other complaints at this time.  No other injury sustained during the incident.  He is not on any blood thinners.  The history is provided by the patient and medical records. No language interpreter was used.  Head Laceration  Pertinent negatives include no chest pain, no abdominal pain, no headaches and no shortness of breath.    Past Medical History:  Diagnosis Date  . AAA (abdominal aortic aneurysm) ([place])   . Diabetes mellitus without complication ([place])   . Hypertension     Patient Active Problem List   Diagnosis Date Noted  . Atherosclerotic vascular disease 07/26/2017  . Continuous dependence on cigarette smoking 07/26/2017  . Pedal edema 07/26/2017  . DOE (dyspnea on exertion) 07/26/2017  . Bilateral leg  edema 07/26/2017  . Abdominal aortic aneurysm (AAA) without rupture ([place]) 10/08/2015  . H/O right inguinal hernia repair 10/08/2015  . Right inguinal hernia 08/13/2015  . Umbilical hernia without obstruction and without gangrene 08/13/2015    Past Surgical History:  Procedure Laterality Date  . APPENDECTOMY  09/17/2015  . HERNIA REPAIR          Home Medications    Prior to Admission medications   Medication Sig Start Date End Date Taking? Authorizing Provider  albuterol (PROAIR HFA) 108 (90 Base) MCG/ACT inhaler  10/05/15   [provider]  amLODipine (NORVASC) 10 MG tablet  07/18/17   [provider]  bisoprolol (ZEBETA) 10 MG tablet  06/21/17   [provider]  hydrALAZINE (APRESOLINE) 50 MG tablet  07/21/17   [provider]  simvastatin (ZOCOR) 20 MG tablet Take 20 mg by mouth.     [provider]  TRADJENTA 5 MG TABS tablet  06/23/17   [provider]    Family History History reviewed. No pertinent family history.  Social History Social History   Tobacco Use  . Smoking status: Never Smoker  . Smokeless tobacco: Never Used  Substance Use Topics  . Alcohol use: No    Alcohol/week: 0.0 oz  . Drug use: No     Allergies   Neosporin [neomycin-bacitracin zn-polymyx]   Review of Systems Review of Systems  Eyes: Negative for visual disturbance.  Respiratory: Negative for shortness of breath.   Cardiovascular: Negative for chest pain.  Gastrointestinal: Negative for abdominal pain,  nausea and vomiting.  Musculoskeletal: Negative for arthralgias, back pain, myalgias and neck pain.  Skin: Positive for wound. Negative for color change.  Allergic/Immunologic: Positive for immunocompromised state (DM2).  Neurological: Negative for dizziness, syncope, weakness, light-headedness, numbness and headaches.  Hematological: Does not bruise/bleed easily.  Psychiatric/Behavioral: Negative for confusion.     Physical  Exam Updated Vital Signs BP 136/61 (BP Location: Right Arm)   Pulse 61   Temp 98.3 F (36.8 C) (Oral)   Resp 18   SpO2 94%   Physical Exam  Constitutional: He is oriented to person, place, and time. Vital signs are normal. He appears well-developed and well-nourished.  Non-toxic appearance. No distress.  Afebrile, nontoxic, NAD  HENT:  Head: Normocephalic. Head is with laceration. Head is without raccoon's eyes, without Battle's sign, without abrasion and without contusion.    Mouth/Throat: Oropharynx is clear and moist and mucous membranes are normal.  ~4cm linear lac to occiput/crown with no ongoing bleeding and no retained FBs, no underlying scalp crepitus or deformity, no bony stepoffs, no focal bony TTP. No contusions or abrasions elsewhere, no battle's sign or raccoon eyes. No s/sx of basilar skull fx.   Eyes: Pupils are equal, round, and reactive to light. Conjunctivae and EOM are normal. Right eye exhibits no discharge. Left eye exhibits no discharge.  PERRL, EOMI, no nystagmus  Neck: Normal range of motion. Neck supple. No spinous process tenderness and no muscular tenderness present. No neck rigidity. Normal range of motion present.  FROM intact without spinous process TTP, no bony stepoffs or deformities, no paraspinous muscle TTP or muscle spasms. No rigidity or meningeal signs. No bruising or swelling.   Cardiovascular: Normal rate, regular rhythm, normal heart sounds and intact distal pulses. Exam reveals no gallop and no friction rub.  No murmur heard. Pulmonary/Chest: Effort normal and breath sounds normal. No respiratory distress. He has no decreased breath sounds. He has no wheezes. He has no rhonchi. He has no rales.  Abdominal: Soft. Normal appearance and bowel sounds are normal. He exhibits no distension. There is no tenderness. There is no rigidity, no rebound, no guarding, no CVA tenderness, no tenderness at McBurney's point and negative Murphy's sign.   Musculoskeletal: Normal range of motion.  MAE x4 Strength and sensation grossly intact in all extremities Distal pulses intact No areas of tenderness or evidence of trauma to any extremity  Neurological: He is alert and oriented to person, place, and time. He has normal strength. No cranial nerve deficit or sensory deficit. Coordination normal. GCS eye subscore is 4. GCS verbal subscore is 5. GCS motor subscore is 6.  CN 2-12 grossly intact A&O x4 GCS 15 Sensation and strength intact Coordination with finger-to-nose WNL Neg pronator drift   Skin: Skin is warm and dry. Laceration noted. No rash noted.  Scalp lac as mentioned above  Psychiatric: He has a normal mood and affect.  Nursing note and vitals reviewed.    ED Treatments / Results  Labs (all labs ordered are listed, but only abnormal results are displayed) Labs Reviewed - No data to display  EKG None  Radiology Ct Head Wo Contrast  Result Date: 10/23/2017 CLINICAL DATA:  Pain following fall EXAM: CT HEAD WITHOUT CONTRAST TECHNIQUE: Contiguous axial images were obtained from the base of the skull through the vertex without intravenous contrast. COMPARISON:  None. FINDINGS: Brain: There is mild diffuse atrophy. There is no intracranial mass, hemorrhage, extra-axial fluid collection, or midline shift. There is evidence of a prior infarct  along the periphery of the left cerebellum laterally. There is evidence of a prior infarct in the posterior left lentiform nucleus. There is patchy small vessel disease throughout the centra semiovale bilaterally. There are patchy areas of small vessel disease in both basal ganglia regions as well as in each thalamus. There is also small vessel disease in the mid to lower pons in the basilar perforator distribution. No evident acute infarct. Vascular: There is no appreciable hyperdense vessel. There is calcification in each carotid siphon and distal vertebral artery. Skull: Bony calvarium appears  intact. Skin staples are noted posteriorly in the midline region without appreciable associated hematoma. Sinuses/Orbits: There is mucosal thickening in several ethmoid air cells bilaterally. There is patchy opacity in the right sphenoid sinus. Other paranasal sinuses are clear. Orbits appear symmetric bilaterally. Other: Mastoid air cells are clear. IMPRESSION: Atrophy with extensive small vessel disease as well as areas of prior infarct as noted. No acute infarct evident. No mass or hemorrhage. Foci of arterial vascular calcification noted. Areas of paranasal sinus disease noted, most notably in the right sphenoid sinus region. Electronically Signed   By: Lowella Grip III M.D.   On: 10/23/2017 14:20    Procedures .Marland KitchenLaceration Repair Date/Time: 10/23/2017 1:45 PM Performed by: Reece Agar, PA-C Authorized by: Reece Agar, [place]   Consent:    Consent obtained:  Verbal   Consent given by:  Patient   Risks discussed:  Pain and retained foreign body   Alternatives discussed:  No treatment Anesthesia (see MAR for exact dosages):    Anesthesia method:  None Laceration details:    Location:  Scalp   Scalp location:  Occipital   Length (cm):  4   Depth (mm):  3 Repair type:    Repair type:  Simple Pre-procedure details:    Preparation:  Patient was prepped and draped in usual sterile fashion and imaging obtained to evaluate for foreign bodies Exploration:    Hemostasis achieved with:  Direct pressure   Wound exploration: wound explored through full range of motion and entire depth of wound probed and visualized     Wound extent: no foreign bodies/material noted and no underlying fracture noted     Contaminated: no   Treatment:    Area cleansed with:  Saline   Amount of cleaning:  Standard   Irrigation solution:  Sterile saline   Irrigation method:  Syringe Skin repair:    Repair method:  Staples   Number of staples:  4 Approximation:    Approximation:   Close Post-procedure details:    Dressing:  Open (no dressing)   Patient tolerance of procedure:  Tolerated well, no immediate complications   (including critical care time)  Medications Ordered in ED Medications  Tdap (BOOSTRIX) injection 0.5 mL (0.5 mLs Intramuscular Given 10/23/17 1426)     Initial Impression / Assessment and Plan / ED Course  I have reviewed the triage vital signs and the nursing notes.  Pertinent labs & imaging results that were available during my care of the patient were reviewed by me and considered in my medical decision making (see chart for details).     84 y.o. male here with mechanical fall, fell back and hit his head on the doorway molding when he was trying to get up from a barstool. On exam, ~4cm linear lac to occiput/crown, bleeding controlled, no underlying scalp crepitus or deformity. No focal neuro deficits. No spinal TTP. Will update Tdap and fix wound, will obtain CT head but otherwise  doubt need for other imaging or labs. Discussed case with my attending Dr. Francia Greaves who agrees with plan.   2:38 PM Wound closed with 4 staples. Doubt need for ppx abx, advised proper wound care and f/up with PCP in 5-7 days for staple removal and wound check. CT head negative for acute findings. Advised tylenol/motrin/ice use for pain control, and PCP f/up this week. I explained the diagnosis and have given explicit precautions to return to the ER including for any other new or worsening symptoms. The patient understands and accepts the medical plan as it's been dictated and I have answered their questions. Discharge instructions concerning home care and prescriptions have been given. The patient is STABLE and is discharged to home in good condition.    Final Clinical Impressions(s) / ED Diagnoses   Final diagnoses:  Laceration of scalp, initial encounter  Fall, initial encounter  Minor head injury, initial encounter    ED Discharge Orders    [address] [place], [place] 10/23/17 1438    Valarie Merino, MD 10/25/17 1714

## 2017-10-23 NOTE — ED Notes (Signed)
Pt stable, ambulatory, states understanding of discharge instructions 

## 2017-10-23 NOTE — ED Notes (Signed)
Patient transported to CT 

## 2017-10-23 NOTE — Discharge Instructions (Signed)
Keep wound clean by using regular shampoo as you normally would for cleaning your hair. Keep area covered with a topical antibiotic ointment and bandage if possible (you can use a hat to keep some gauze over the area), keep bandage dry, and do not submerge in water for 24 hours. Ice and elevate for additional pain and swelling relief. Alternate between Ibuprofen and Tylenol for additional pain relief. Follow up with your primary care doctor or the [facility] in approximately 5-7 days for wound recheck and staple removal. Monitor area for signs of infection to include, but not limited to: increasing pain, spreading redness, drainage/pus, worsening swelling, or fevers. Return to emergency department for emergent changing or worsening symptoms.

## 2017-10-23 NOTE — ED Triage Notes (Signed)
Pt arrived via [facility] from [place] [employer] pt had mechanical fall and fell off stool after eating lunch.  Posterior head lac, not on blood thinners, no LOC, bleeding controlled.

## 2017-10-25 DIAGNOSIS — E669 Obesity, unspecified: Secondary | ICD-10-CM | POA: Diagnosis not present

## 2017-10-25 DIAGNOSIS — D649 Anemia, unspecified: Secondary | ICD-10-CM | POA: Diagnosis not present

## 2017-10-25 DIAGNOSIS — I10 Essential (primary) hypertension: Secondary | ICD-10-CM | POA: Diagnosis not present

## 2017-10-25 DIAGNOSIS — Z794 Long term (current) use of insulin: Secondary | ICD-10-CM | POA: Diagnosis not present

## 2017-10-25 DIAGNOSIS — K5900 Constipation, unspecified: Secondary | ICD-10-CM | POA: Diagnosis not present

## 2017-10-25 DIAGNOSIS — W19XXXA Unspecified fall, initial encounter: Secondary | ICD-10-CM | POA: Diagnosis not present

## 2017-10-25 DIAGNOSIS — E785 Hyperlipidemia, unspecified: Secondary | ICD-10-CM | POA: Diagnosis not present

## 2017-10-25 DIAGNOSIS — R0902 Hypoxemia: Secondary | ICD-10-CM | POA: Diagnosis not present

## 2017-10-25 DIAGNOSIS — E11630 Type 2 diabetes mellitus with periodontal disease: Secondary | ICD-10-CM | POA: Diagnosis not present

## 2017-10-25 DIAGNOSIS — S0990XA Unspecified injury of head, initial encounter: Secondary | ICD-10-CM | POA: Diagnosis not present

## 2017-10-25 DIAGNOSIS — S0191XA Laceration without foreign body of unspecified part of head, initial encounter: Secondary | ICD-10-CM | POA: Diagnosis not present

## 2017-10-25 DIAGNOSIS — E1151 Type 2 diabetes mellitus with diabetic peripheral angiopathy without gangrene: Secondary | ICD-10-CM | POA: Diagnosis not present

## 2017-10-25 DIAGNOSIS — J449 Chronic obstructive pulmonary disease, unspecified: Secondary | ICD-10-CM | POA: Diagnosis not present

## 2017-10-30 ENCOUNTER — Emergency Department (HOSPITAL_COMMUNITY)
Admission: EM | Admit: 2017-10-30 | Discharge: 2017-10-30 | Disposition: A | Discharge status: Home / Self Care | Payer: Medicare HMO | Attending: Emergency Medicine | Admitting: Emergency Medicine

## 2017-10-30 ENCOUNTER — Other Ambulatory Visit: Payer: Self-pay

## 2017-10-30 ENCOUNTER — Encounter (HOSPITAL_COMMUNITY): Payer: Self-pay | Admitting: *Deleted

## 2017-10-30 DIAGNOSIS — S0101XD Laceration without foreign body of scalp, subsequent encounter: Secondary | ICD-10-CM | POA: Diagnosis not present

## 2017-10-30 DIAGNOSIS — W19XXXD Unspecified fall, subsequent encounter: Secondary | ICD-10-CM | POA: Diagnosis not present

## 2017-10-30 DIAGNOSIS — I10 Essential (primary) hypertension: Secondary | ICD-10-CM | POA: Diagnosis not present

## 2017-10-30 DIAGNOSIS — E119 Type 2 diabetes mellitus without complications: Secondary | ICD-10-CM | POA: Insufficient documentation

## 2017-10-30 DIAGNOSIS — Z4802 Encounter for removal of sutures: Secondary | ICD-10-CM | POA: Diagnosis not present

## 2017-10-30 DIAGNOSIS — Z7984 Long term (current) use of oral hypoglycemic drugs: Secondary | ICD-10-CM | POA: Diagnosis not present

## 2017-10-30 DIAGNOSIS — Z79899 Other long term (current) drug therapy: Secondary | ICD-10-CM | POA: Insufficient documentation

## 2017-10-30 NOTE — ED Provider Notes (Signed)
[facility] DEPARTMENT Provider Note   CSN: [id number] Arrival date & time: 10/30/17  1045     History   Chief Complaint Chief Complaint  Patient presents with  . Suture / Staple Removal    HPI Ethan Walters is a 84 y.o. male who presents the emergent department today for staple removal.  Patient was seen here 7 days ago, 5/13 after a mechanical fall with a laceration to his posterior scalp with 5 staples placed.  He states they have been healing as appropriately without any complications or difficulties.  He denies any drainage/discharge, fever, chills,, surrounding erythema, swelling.  He has been cleaning it as directed.  No other complaints at this time.  HPI  Past Medical History:  Diagnosis Date  . AAA (abdominal aortic aneurysm) ([place])   . Diabetes mellitus without complication ([place])   . Hypertension     Patient Active Problem List   Diagnosis Date Noted  . Atherosclerotic vascular disease 07/26/2017  . Continuous dependence on cigarette smoking 07/26/2017  . Pedal edema 07/26/2017  . DOE (dyspnea on exertion) 07/26/2017  . Bilateral leg edema 07/26/2017  . Abdominal aortic aneurysm (AAA) without rupture ([place]) 10/08/2015  . H/O right inguinal hernia repair 10/08/2015  . Right inguinal hernia 08/13/2015  . Umbilical hernia without obstruction and without gangrene 08/13/2015    Past Surgical History:  Procedure Laterality Date  . APPENDECTOMY  09/17/2015  . HERNIA REPAIR          Home Medications    Prior to Admission medications   Medication Sig Start Date End Date Taking? Authorizing Provider  albuterol (PROAIR HFA) 108 (90 Base) MCG/ACT inhaler  10/05/15   [provider]  amLODipine (NORVASC) 10 MG tablet  07/18/17   [provider]  bisoprolol (ZEBETA) 10 MG tablet  06/21/17   [provider]  hydrALAZINE (APRESOLINE) 50 MG tablet  07/21/17   [provider]  simvastatin (ZOCOR) 20 MG tablet  Take 20 mg by mouth.     [provider]  TRADJENTA 5 MG TABS tablet  06/23/17   [provider]    Family History No family history on file.  Social History Social History   Tobacco Use  . Smoking status: Never Smoker  . Smokeless tobacco: Never Used  Substance Use Topics  . Alcohol use: No    Alcohol/week: 0.0 oz  . Drug use: No     Allergies   Neosporin [neomycin-bacitracin zn-polymyx]   Review of Systems Review of Systems  Constitutional: Negative for fever.  Skin: Negative for color change.  Neurological: Negative for light-headedness and headaches.     Physical Exam Updated Vital Signs BP 125/77 (BP Location: Left Arm)   Pulse (!) 54   Temp 97.7 F (36.5 C) (Oral)   Resp 18   SpO2 95%   Physical Exam  Constitutional: He appears well-developed and well-nourished.  HENT:  Head: Normocephalic and atraumatic.  Right Ear: External ear normal.  Left Ear: External ear normal.  ~4cm linear laceration with 4 staples in place that appears to be healing well. No surrounding or overlying heat, erythema, fluctuance, induration, or drainage. Appears to be healing well.   Eyes: Conjunctivae are normal. Right eye exhibits no discharge. Left eye exhibits no discharge. No scleral icterus.  Pulmonary/Chest: Effort normal. No respiratory distress.  Neurological: He is alert.  Skin: No pallor.  Psychiatric: He has a normal mood and affect.  Nursing note and vitals reviewed.  ED Treatments / Results  Labs (all labs ordered are listed, but only abnormal results are displayed) Labs Reviewed - No data to display  EKG None  Radiology No results found.  Procedures .Suture Removal Date/Time: 10/30/2017 11:46 AM Performed by: Jillyn Ledger, PA-C Authorized by: Jillyn Ledger, PA-C   Consent:    Consent obtained:  Verbal   Consent given by:  Patient   Risks discussed:  Bleeding, pain and wound separation   Alternatives discussed:  No  treatment Location:    Location:  Head/neck   Head/neck location:  Scalp Procedure details:    Wound appearance:  No signs of infection, good wound healing and clean   Number of staples removed:  5 Post-procedure details:    Post-removal:  No dressing applied   Patient tolerance of procedure:  Tolerated well, no immediate complications   (including critical care time)  Medications Ordered in ED Medications - No data to display   Initial Impression / Assessment and Plan / ED Course  I have reviewed the triage vital signs and the nursing notes.  Pertinent labs & imaging results that were available during my care of the patient were reviewed by me and considered in my medical decision making (see chart for details).     Staple removal   Pt to ER for staple/suture removal and wound check as above. Procedure tolerated well. Vitals normal, no signs of infection. Scar minimization & return precautions given at dc.   Final Clinical Impressions(s) / ED Diagnoses   Final diagnoses:  Visit for suture removal    ED Discharge Orders    None       Jillyn Ledger, Hershal Coria 10/30/17 1148    Long, W[facility], MD 10/30/17 1931

## 2017-10-30 NOTE — ED Notes (Signed)
Pt verbalized understanding discharge instructions and denies any further needs or questions at this time. VS stable, ambulatory and steady gait.   

## 2017-10-30 NOTE — ED Triage Notes (Signed)
Pt is here to have staples removed from posterior head and were placed last Monday.

## 2017-11-01 DIAGNOSIS — E113292 Type 2 diabetes mellitus with mild nonproliferative diabetic retinopathy without macular edema, left eye: Secondary | ICD-10-CM | POA: Diagnosis not present

## 2017-11-01 DIAGNOSIS — H401111 Primary open-angle glaucoma, right eye, mild stage: Secondary | ICD-10-CM | POA: Diagnosis not present

## 2017-11-01 DIAGNOSIS — H348312 Tributary (branch) retinal vein occlusion, right eye, stable: Secondary | ICD-10-CM | POA: Diagnosis not present

## 2017-11-01 DIAGNOSIS — H35351 Cystoid macular degeneration, right eye: Secondary | ICD-10-CM | POA: Diagnosis not present

## 2017-11-09 DIAGNOSIS — E875 Hyperkalemia: Secondary | ICD-10-CM | POA: Diagnosis not present

## 2017-11-09 DIAGNOSIS — I129 Hypertensive chronic kidney disease with stage 1 through stage 4 chronic kidney disease, or unspecified chronic kidney disease: Secondary | ICD-10-CM | POA: Diagnosis not present

## 2017-11-09 DIAGNOSIS — E8770 Fluid overload, unspecified: Secondary | ICD-10-CM | POA: Diagnosis not present

## 2017-11-09 DIAGNOSIS — N183 Chronic kidney disease, stage 3 (moderate): Secondary | ICD-10-CM | POA: Diagnosis not present

## 2017-11-09 DIAGNOSIS — E1122 Type 2 diabetes mellitus with diabetic chronic kidney disease: Secondary | ICD-10-CM | POA: Diagnosis not present

## 2017-11-10 ENCOUNTER — Other Ambulatory Visit (HOSPITAL_COMMUNITY): Payer: Medicare HMO

## 2017-11-10 ENCOUNTER — Encounter (HOSPITAL_COMMUNITY): Payer: Medicare HMO

## 2017-11-10 ENCOUNTER — Ambulatory Visit: Payer: Medicare HMO | Admitting: Family

## 2017-11-17 ENCOUNTER — Other Ambulatory Visit: Payer: Self-pay

## 2017-11-17 ENCOUNTER — Ambulatory Visit: Payer: Medicare HMO | Admitting: Family

## 2017-11-17 ENCOUNTER — Encounter: Payer: Self-pay | Admitting: Family

## 2017-11-17 ENCOUNTER — Ambulatory Visit (INDEPENDENT_AMBULATORY_CARE_PROVIDER_SITE_OTHER)
Admission: RE | Admit: 2017-11-17 | Discharge: 2017-11-17 | Disposition: A | Discharge status: Home / Self Care | Payer: Medicare HMO | Source: Ambulatory Visit | Attending: Family | Admitting: Family

## 2017-11-17 ENCOUNTER — Ambulatory Visit (INDEPENDENT_AMBULATORY_CARE_PROVIDER_SITE_OTHER): Payer: Medicare HMO | Admitting: Family

## 2017-11-17 ENCOUNTER — Ambulatory Visit (HOSPITAL_COMMUNITY)
Admission: RE | Admit: 2017-11-17 | Discharge: 2017-11-17 | Disposition: A | Discharge status: Home / Self Care | Payer: Medicare HMO | Source: Ambulatory Visit | Attending: Family | Admitting: Family

## 2017-11-17 VITALS — BP 140/76 | HR 55 | Resp 20 | Ht 69.0 in | Wt 212.5 lb

## 2017-11-17 DIAGNOSIS — I713 Abdominal aortic aneurysm, ruptured, unspecified: Secondary | ICD-10-CM

## 2017-11-17 DIAGNOSIS — I714 Abdominal aortic aneurysm, without rupture, unspecified: Secondary | ICD-10-CM

## 2017-11-17 DIAGNOSIS — I739 Peripheral vascular disease, unspecified: Secondary | ICD-10-CM | POA: Diagnosis not present

## 2017-11-17 DIAGNOSIS — R0989 Other specified symptoms and signs involving the circulatory and respiratory systems: Secondary | ICD-10-CM | POA: Diagnosis not present

## 2017-11-17 DIAGNOSIS — I779 Disorder of arteries and arterioles, unspecified: Secondary | ICD-10-CM

## 2017-11-17 NOTE — Patient Instructions (Addendum)
Abdominal Aortic Aneurysm Blood pumps away from the heart through tubes (blood vessels) called arteries. Aneurysms are weak or damaged places in the wall of an artery. It bulges out like a balloon. An abdominal aortic aneurysm happens in the main artery of the body (aorta). It can burst or tear, causing bleeding inside the body. This is an emergency. It needs treatment right away. What are the causes? The exact cause is unknown. Things that could cause this problem include:  Fat and other substances building up in the lining of a tube.  Swelling of the walls of a blood vessel.  Certain tissue diseases.  Belly (abdominal) trauma.  An infection in the main artery of the body.  What increases the risk? There are things that make it more likely for you to have an aneurysm. These include:  Being over the age of 82 years old.  Having high blood pressure (hypertension).  Being a male.  Being white.  Being very overweight (obese).  Having a family history of aneurysm.  Using tobacco products.  What are the signs or symptoms? Symptoms depend on the size of the aneurysm and how fast it grows. There may not be symptoms. If symptoms occur, they can include:  Pain (belly, side, lower back, or groin).  Feeling full after eating a small amount of food.  Feeling sick to your stomach (nauseous), throwing up (vomiting), or both.  Feeling a lump in your belly that feels like it is beating (pulsating).  Feeling like you will pass out (faint).  How is this treated?  Medicine to control blood pressure and pain.  Imaging tests to see if the aneurysm gets bigger.  Surgery. How is this prevented? To lessen your chance of getting this condition:  Stop smoking. Stop chewing tobacco.  Limit or avoid alcohol.  Keep your blood pressure, blood sugar, and cholesterol within normal limits.  Eat less salt.  Eat foods low in saturated fats and cholesterol. These are found in animal and  whole dairy products.  Eat more fiber. Fiber is found in whole grains, vegetables, and fruits.  Keep a healthy weight.  Stay active and exercise often.  This information is not intended to replace advice given to you by your health care provider. Make sure you discuss any questions you have with your health care provider. Document Released: 09/24/2012 Document Revised: 11/05/2015 Document Reviewed: 06/29/2012 Elsevier Interactive Patient Education  2017 [place].     Peripheral Vascular Disease Peripheral vascular disease (PVD) is a disease of the blood vessels that are not part of your heart and brain. A simple term for PVD is poor circulation. In most cases, PVD narrows the blood vessels that carry blood from your heart to the rest of your body. This can result in a decreased supply of blood to your arms, legs, and internal organs, like your stomach or kidneys. However, it most often affects a person's lower legs and feet. There are two types of PVD.  Organic PVD. This is the more common type. It is caused by damage to the structure of blood vessels.  Functional PVD. This is caused by conditions that make blood vessels contract and tighten (spasm).  Without treatment, PVD tends to get worse over time. PVD can also lead to acute ischemic limb. This is when an arm or limb suddenly has trouble getting enough blood. This is a medical emergency. Follow these instructions at home:  Take medicines only as told by your doctor.  Do not use  any tobacco products, including cigarettes, chewing tobacco, or electronic cigarettes. If you need help quitting, ask your doctor.  Lose weight if you are overweight, and maintain a healthy weight as told by your doctor.  Eat a diet that is low in fat and cholesterol. If you need help, ask your doctor.  Exercise regularly. Ask your doctor for some good activities for you.  Take good care of your feet. ? Wear comfortable shoes that fit  well. ? Check your feet often for any cuts or sores. Contact a doctor if:  You have cramps in your legs while walking.  You have leg pain when you are at rest.  You have coldness in a leg or foot.  Your skin changes.  You are unable to get or have an erection (erectile dysfunction).  You have cuts or sores on your feet that are not healing. Get help right away if:  Your arm or leg turns cold and blue.  Your arms or legs become red, warm, swollen, painful, or numb.  You have chest pain or trouble breathing.  You suddenly have weakness in your face, arm, or leg.  You become very confused or you cannot speak.  You suddenly have a very bad headache.  You suddenly cannot see. This information is not intended to replace advice given to you by your health care provider. Make sure you discuss any questions you have with your health care provider. Document Released: 08/24/2009 Document Revised: 11/05/2015 Document Reviewed: 11/07/2013 Elsevier Interactive Patient Education  2017 [employer].

## 2017-11-17 NOTE — Progress Notes (Addendum)
[facility] [place] HISTORY AND PHYSICAL   CC: Follow up AAA   History of Present Illness:   Ethan Walters is a 84 y.o. male who returns for follow up of his AAA. Dr. Bridgett Walters has been monitoring pt. Previous studies demonstrate an AAA, measuring 4.9 cm x 4.5cm. The patient does nothave back or abdominal pain. The patient is nota smoker.  He states he does not walk much as his balance is off and his back hurts when he walks, uses a cane, but does not seem to indicate claudication sx's with walking.  The patient denies history of stroke or TIA symptoms.  He checks his blood pressure, was 140's/50's most recently at home.  Wife states he has a "chronic kidney problem". He states that over a year he urinates frequently; I advised pt to let his PCP know this.  He has been dyspneic ever since he quit smoking in 2016.   Last serum creatinine result on file was 1.58 on 07-24-17, results from his PCP.   Diabetic: Yes, pt states he does not know what his A1C is, no result on file, pt states his A1C is now "fine" Tobacco use: former smoker, states he quit in 2016, started at age 40    Pt meds include: Statin :Yes Betablocker: Yes ASA: Yes, 81 mg daily, not on his list Other anticoagulants/antiplatelets: no  Current Outpatient Medications  Medication Sig Dispense Refill  . albuterol (PROAIR HFA) 108 (90 Base) MCG/ACT inhaler     . amLODipine (NORVASC) 10 MG tablet     . bisoprolol (ZEBETA) 10 MG tablet     . hydrALAZINE (APRESOLINE) 50 MG tablet     . simvastatin (ZOCOR) 20 MG tablet Take 20 mg by mouth.     . TRADJENTA 5 MG TABS tablet      No current facility-administered medications for this visit.     Past Medical History:  Diagnosis Date  . AAA (abdominal aortic aneurysm) ([place])   . Diabetes mellitus without complication ([place])   . Hypertension     Social History Social History   Tobacco Use  . Smoking status: Never Smoker  . Smokeless  tobacco: Never Used  Substance Use Topics  . Alcohol use: No    Alcohol/week: 0.0 oz  . Drug use: No    Family History History reviewed. No pertinent family history.  Surgical History Past Surgical History:  Procedure Laterality Date  . APPENDECTOMY  09/17/2015  . HERNIA REPAIR      Allergies  Allergen Reactions  . Neosporin [Neomycin-Bacitracin Zn-Polymyx] Swelling    Current Outpatient Medications  Medication Sig Dispense Refill  . albuterol (PROAIR HFA) 108 (90 Base) MCG/ACT inhaler     . amLODipine (NORVASC) 10 MG tablet     . bisoprolol (ZEBETA) 10 MG tablet     . hydrALAZINE (APRESOLINE) 50 MG tablet     . simvastatin (ZOCOR) 20 MG tablet Take 20 mg by mouth.     . TRADJENTA 5 MG TABS tablet      No current facility-administered medications for this visit.      REVIEW OF SYSTEMS: See HPI for pertinent positives and negatives.  Physical Examination Vitals:   11/17/17 0954  BP: 140/76  Pulse: (!) 55  Resp: 20  SpO2: 95%  Weight: 212 lb 8 oz (96.4 kg)  Height: 5\' 9"  (1.753 m)   Body mass index is 31.38 kg/m.   General:  WDWN obese male in NAD Gait: slow,  fairly steady, using cane HENT: WNL, no gross abnromalities Eyes: PERRLA Pulmonary: slightly labored breathing, fair air movement, CTAB, no rales, rhonchi, or wheezing. Wearing Ethan Walters, supplemental O2 Cardiac: Regular rhythm, brdaycardic, no murmur detected Abdomen: soft, NT, no masses palpated Skin: no rashes, no ulcers, no cellulitis. See Extremities  VASCULAR EXAM  Carotid Bruits Right Left   Negative Negative      Radial pulses are 2+ palpable bilaterally   Adominal aortic pulse is not palpable                      VASCULAR EXAM: Extremities without ischemic changes, without Gangrene; without open wounds. 2+ pitting and non pitting pretibial, feet, and ankle edema. Mild venous stasis dermatitis.                                                                                                            LE Pulses Right Left       FEMORAL  not palpable  2+ palpable        POPLITEAL  not palpable   not palpable       POSTERIOR TIBIAL  not palpable   not palpable        DORSALIS PEDIS      ANTERIOR TIBIAL not palpable  not palpable     Musculoskeletal: no muscle wasting or atrophy; no peripheral edema  Neurologic:  A&O X 3; appropriate affect, sensation is normal; speech is normal, CN 2-12 intact, pain and light touch intact in extremities, motor exam as listed above. Psychiatric: Normal thought content, mood appropriate to clinical situation.    ASSESSMENT:  Ethan Walters is a 84 y.o. male who has an asymptomatic AAA at 5.17 cm today, based on limited visualization. Marland Kitchen He also has mild PAD, does not seem to walk much due to balance issues.  There are no signs of ischemia in his feet or legs.   DATA  AAA Duplex   Previous (05-12-17):  5.3 cm (Date: 05-12-17); Right CIA: 1.2 cm; Left CIA: 1.3 cm. Decreased visualization of the abdominal vasculature due to overlying bowel gas and pt body habitus. No significant stenosis of the abdominal aorta and bilateral proximal common iliac arteries. SMA velocity 263 cm/s. Threshold for >70% stenosis is 275 cm/s.  Current: 5.17 cm; Unable to visualize bilateral CIA due to overlying bowel gas.   Right popliteal artery duplex (11/17/17): No right popliteal artery aneurysm, monophasic waveforms    ABI (Date: 11/17/2017):  R:   ABI: 0.85 (no previous),   PT: bi  DP: mono  TBI:  0.66  L:   ABI: 0.91,   PT: bi  DP: mono  TBI: 0.69  Mild disease in bilateral lower extremities, with bi and monophasic waveforms.    PLAN:   Based on today's exam and non-invasive vascular lab results, and after discussing with Dr. Donzetta Matters, the patient will be scheduled for CTA abd/pelvis; will need hydration prior as last serum creatinine result on file was 1.58 on 07-24-17.   I discussed in depth  with the patient the nature of  atherosclerosis, and emphasized the importance of maximal medical management including strict control of blood pressure, blood glucose, and lipid levels, obtaining regular exercise, and cessation of smoking.  The patient is aware that without maximal medical management the underlying atherosclerotic disease process will progress, limiting the benefit of any interventions.  Consideration for repair of AAA would be made when the size approaches 5.0 cm, growth > 1 cm/yr, and symptomatic status. The patient was given information about AAA including signs, symptoms, treatment,  what symptoms should prompt the patient to seek immediate medical care, and how to minimize the risk of enlargement and rupture of aneurysms.   The patient was given information about PAD including signs, symptoms, treatment, what symptoms should prompt the patient to seek immediate medical care, and risk reduction measures to take.  Thank you for allowing [place] to participate in this patient's care.  Clemon Chambers, RN, MSN, FNP-C [facility] Office: [phone]  Clinic MD: Donzetta Matters 11/17/2017 9:58 AM

## 2017-11-18 DIAGNOSIS — J449 Chronic obstructive pulmonary disease, unspecified: Secondary | ICD-10-CM | POA: Diagnosis not present

## 2017-11-20 ENCOUNTER — Telehealth: Payer: Self-pay

## 2017-11-20 NOTE — Telephone Encounter (Signed)
-----   Message from Jenean Lindau, MD sent at 11/17/2017  4:57 PM EDT ----- Inform patient about the report.  The aneurysm is stable.  He needs to recheck this in 6 months.  I would like to see a recent lipid check report on this patient.  If there is not one from recent we would like to get it done.  Send copy to PCP Jenean Lindau, MD 11/17/2017 4:56 PM

## 2017-11-20 NOTE — Telephone Encounter (Signed)
Left message to return call for results of AAA duplex and to determine if patient has had recent lipid panel.

## 2017-11-22 ENCOUNTER — Other Ambulatory Visit: Payer: Self-pay

## 2017-11-22 ENCOUNTER — Telehealth: Payer: Self-pay

## 2017-11-22 DIAGNOSIS — I7090 Unspecified atherosclerosis: Secondary | ICD-10-CM

## 2017-11-22 NOTE — Telephone Encounter (Signed)
Left voicemail regarding cardiac imaging.

## 2017-11-22 NOTE — Telephone Encounter (Signed)
Left message to return call 

## 2017-11-27 ENCOUNTER — Ambulatory Visit (HOSPITAL_COMMUNITY)
Admission: RE | Admit: 2017-11-27 | Discharge: 2017-11-27 | Disposition: A | Discharge status: Home / Self Care | Payer: Medicare HMO | Source: Ambulatory Visit | Attending: Vascular Surgery | Admitting: Vascular Surgery

## 2017-11-27 ENCOUNTER — Other Ambulatory Visit: Payer: Self-pay | Admitting: *Deleted

## 2017-11-27 DIAGNOSIS — I708 Atherosclerosis of other arteries: Secondary | ICD-10-CM | POA: Diagnosis not present

## 2017-11-27 DIAGNOSIS — I714 Abdominal aortic aneurysm, without rupture: Secondary | ICD-10-CM | POA: Insufficient documentation

## 2017-11-27 DIAGNOSIS — I713 Abdominal aortic aneurysm, ruptured, unspecified: Secondary | ICD-10-CM

## 2017-11-27 LAB — POCT I-STAT CREATININE: Creatinine, Ser: 1.5 mg/dL — ABNORMAL HIGH (ref 0.61–1.24)

## 2017-11-27 MED ORDER — IOPAMIDOL (ISOVUE-370) INJECTION 76%
INTRAVENOUS | Status: AC
  Filled 2017-11-27: qty 100

## 2017-11-27 MED ORDER — IOPAMIDOL (ISOVUE-370) INJECTION 76%
100.0000 mL | Freq: Once | INTRAVENOUS | Status: AC
  Administered 2017-11-27: 75 mL via INTRAVENOUS

## 2017-11-29 NOTE — Telephone Encounter (Signed)
Spoke with patient on 06/13. Liver and lipid panel have both been ordered.

## 2017-12-04 ENCOUNTER — Telehealth: Payer: Self-pay

## 2017-12-04 DIAGNOSIS — I7090 Unspecified atherosclerosis: Secondary | ICD-10-CM | POA: Diagnosis not present

## 2017-12-04 LAB — HEPATIC FUNCTION PANEL
ALBUMIN: 4.3 g/dL (ref 3.5–4.7)
ALT: 11 IU/L (ref 0–44)
AST: 14 IU/L (ref 0–40)
Alkaline Phosphatase: 109 IU/L (ref 39–117)
Bilirubin Total: 0.4 mg/dL (ref 0.0–1.2)
Bilirubin, Direct: <0.1 mg/dL (ref 0.00–0.40)
Total Protein: 6.6 g/dL (ref 6.0–8.5)

## 2017-12-04 NOTE — Telephone Encounter (Signed)
Called patient and left a detailed voice message on phone regarding results. 

## 2017-12-05 LAB — LIPID PANEL
CHOL/HDL RATIO: 4.9 ratio (ref 0.0–5.0)
Cholesterol, Total: 162 mg/dL (ref 100–199)
HDL: 33 mg/dL — AB (ref >39)
LDL Calculated: 99 mg/dL (ref 0–99)
Triglycerides: 151 mg/dL — ABNORMAL HIGH (ref 0–149)
VLDL CHOLESTEROL CAL: 30 mg/dL (ref 5–40)

## 2017-12-06 DIAGNOSIS — L03039 Cellulitis of unspecified toe: Secondary | ICD-10-CM | POA: Diagnosis not present

## 2017-12-07 ENCOUNTER — Telehealth: Payer: Self-pay

## 2017-12-07 MED ORDER — SIMVASTATIN 40 MG PO TABS: 40.0000 mg | ORAL_TABLET | Freq: Every day | ORAL | 3 refills | Status: AC

## 2017-12-07 NOTE — Addendum Note (Signed)
Addended by: Tarri Glenn on: 12/07/2017 03:27 PM   Modules accepted: Orders

## 2017-12-07 NOTE — Telephone Encounter (Signed)
I called patient ad left detailed voice message about labs and med changes.

## 2017-12-18 DIAGNOSIS — J449 Chronic obstructive pulmonary disease, unspecified: Secondary | ICD-10-CM | POA: Diagnosis not present

## 2017-12-21 DIAGNOSIS — M79674 Pain in right toe(s): Secondary | ICD-10-CM | POA: Diagnosis not present

## 2017-12-21 DIAGNOSIS — L0390 Cellulitis, unspecified: Secondary | ICD-10-CM | POA: Diagnosis not present

## 2017-12-21 DIAGNOSIS — E1122 Type 2 diabetes mellitus with diabetic chronic kidney disease: Secondary | ICD-10-CM | POA: Diagnosis not present

## 2017-12-21 DIAGNOSIS — Z1339 Encounter for screening examination for other mental health and behavioral disorders: Secondary | ICD-10-CM | POA: Diagnosis not present

## 2017-12-21 DIAGNOSIS — T148XXA Other injury of unspecified body region, initial encounter: Secondary | ICD-10-CM | POA: Diagnosis not present

## 2017-12-23 DIAGNOSIS — M109 Gout, unspecified: Secondary | ICD-10-CM | POA: Diagnosis not present

## 2017-12-23 DIAGNOSIS — E785 Hyperlipidemia, unspecified: Secondary | ICD-10-CM | POA: Diagnosis not present

## 2017-12-23 DIAGNOSIS — E1122 Type 2 diabetes mellitus with diabetic chronic kidney disease: Secondary | ICD-10-CM | POA: Diagnosis not present

## 2017-12-23 DIAGNOSIS — Z9981 Dependence on supplemental oxygen: Secondary | ICD-10-CM | POA: Diagnosis not present

## 2017-12-23 DIAGNOSIS — L600 Ingrowing nail: Secondary | ICD-10-CM | POA: Diagnosis not present

## 2017-12-23 DIAGNOSIS — S81801A Unspecified open wound, right lower leg, initial encounter: Secondary | ICD-10-CM | POA: Diagnosis not present

## 2017-12-23 DIAGNOSIS — J449 Chronic obstructive pulmonary disease, unspecified: Secondary | ICD-10-CM | POA: Diagnosis not present

## 2017-12-23 DIAGNOSIS — I5030 Unspecified diastolic (congestive) heart failure: Secondary | ICD-10-CM | POA: Diagnosis not present

## 2017-12-23 DIAGNOSIS — N182 Chronic kidney disease, stage 2 (mild): Secondary | ICD-10-CM | POA: Diagnosis not present

## 2017-12-23 DIAGNOSIS — Z9181 History of falling: Secondary | ICD-10-CM | POA: Diagnosis not present

## 2017-12-23 DIAGNOSIS — L03031 Cellulitis of right toe: Secondary | ICD-10-CM | POA: Diagnosis not present

## 2017-12-23 DIAGNOSIS — I130 Hypertensive heart and chronic kidney disease with heart failure and stage 1 through stage 4 chronic kidney disease, or unspecified chronic kidney disease: Secondary | ICD-10-CM | POA: Diagnosis not present

## 2017-12-26 DIAGNOSIS — L600 Ingrowing nail: Secondary | ICD-10-CM | POA: Diagnosis not present

## 2017-12-26 DIAGNOSIS — L0390 Cellulitis, unspecified: Secondary | ICD-10-CM | POA: Diagnosis not present

## 2017-12-27 DIAGNOSIS — N182 Chronic kidney disease, stage 2 (mild): Secondary | ICD-10-CM | POA: Diagnosis not present

## 2017-12-27 DIAGNOSIS — I5030 Unspecified diastolic (congestive) heart failure: Secondary | ICD-10-CM | POA: Diagnosis not present

## 2017-12-27 DIAGNOSIS — E785 Hyperlipidemia, unspecified: Secondary | ICD-10-CM | POA: Diagnosis not present

## 2017-12-27 DIAGNOSIS — Z9181 History of falling: Secondary | ICD-10-CM | POA: Diagnosis not present

## 2017-12-27 DIAGNOSIS — L03031 Cellulitis of right toe: Secondary | ICD-10-CM | POA: Diagnosis not present

## 2017-12-27 DIAGNOSIS — L600 Ingrowing nail: Secondary | ICD-10-CM | POA: Diagnosis not present

## 2017-12-27 DIAGNOSIS — M109 Gout, unspecified: Secondary | ICD-10-CM | POA: Diagnosis not present

## 2017-12-27 DIAGNOSIS — J449 Chronic obstructive pulmonary disease, unspecified: Secondary | ICD-10-CM | POA: Diagnosis not present

## 2017-12-27 DIAGNOSIS — I130 Hypertensive heart and chronic kidney disease with heart failure and stage 1 through stage 4 chronic kidney disease, or unspecified chronic kidney disease: Secondary | ICD-10-CM | POA: Diagnosis not present

## 2017-12-27 DIAGNOSIS — E1122 Type 2 diabetes mellitus with diabetic chronic kidney disease: Secondary | ICD-10-CM | POA: Diagnosis not present

## 2017-12-29 DIAGNOSIS — E785 Hyperlipidemia, unspecified: Secondary | ICD-10-CM | POA: Diagnosis not present

## 2017-12-29 DIAGNOSIS — L600 Ingrowing nail: Secondary | ICD-10-CM | POA: Diagnosis not present

## 2017-12-29 DIAGNOSIS — N182 Chronic kidney disease, stage 2 (mild): Secondary | ICD-10-CM | POA: Diagnosis not present

## 2017-12-29 DIAGNOSIS — L03031 Cellulitis of right toe: Secondary | ICD-10-CM | POA: Diagnosis not present

## 2017-12-29 DIAGNOSIS — I130 Hypertensive heart and chronic kidney disease with heart failure and stage 1 through stage 4 chronic kidney disease, or unspecified chronic kidney disease: Secondary | ICD-10-CM | POA: Diagnosis not present

## 2017-12-29 DIAGNOSIS — M109 Gout, unspecified: Secondary | ICD-10-CM | POA: Diagnosis not present

## 2017-12-29 DIAGNOSIS — I5030 Unspecified diastolic (congestive) heart failure: Secondary | ICD-10-CM | POA: Diagnosis not present

## 2017-12-29 DIAGNOSIS — J449 Chronic obstructive pulmonary disease, unspecified: Secondary | ICD-10-CM | POA: Diagnosis not present

## 2017-12-29 DIAGNOSIS — Z9181 History of falling: Secondary | ICD-10-CM | POA: Diagnosis not present

## 2017-12-29 DIAGNOSIS — E1122 Type 2 diabetes mellitus with diabetic chronic kidney disease: Secondary | ICD-10-CM | POA: Diagnosis not present

## 2018-01-01 DIAGNOSIS — E785 Hyperlipidemia, unspecified: Secondary | ICD-10-CM | POA: Diagnosis not present

## 2018-01-01 DIAGNOSIS — N182 Chronic kidney disease, stage 2 (mild): Secondary | ICD-10-CM | POA: Diagnosis not present

## 2018-01-01 DIAGNOSIS — I5030 Unspecified diastolic (congestive) heart failure: Secondary | ICD-10-CM | POA: Diagnosis not present

## 2018-01-01 DIAGNOSIS — I130 Hypertensive heart and chronic kidney disease with heart failure and stage 1 through stage 4 chronic kidney disease, or unspecified chronic kidney disease: Secondary | ICD-10-CM | POA: Diagnosis not present

## 2018-01-01 DIAGNOSIS — E1122 Type 2 diabetes mellitus with diabetic chronic kidney disease: Secondary | ICD-10-CM | POA: Diagnosis not present

## 2018-01-01 DIAGNOSIS — Z9181 History of falling: Secondary | ICD-10-CM | POA: Diagnosis not present

## 2018-01-01 DIAGNOSIS — M109 Gout, unspecified: Secondary | ICD-10-CM | POA: Diagnosis not present

## 2018-01-01 DIAGNOSIS — J449 Chronic obstructive pulmonary disease, unspecified: Secondary | ICD-10-CM | POA: Diagnosis not present

## 2018-01-01 DIAGNOSIS — L03031 Cellulitis of right toe: Secondary | ICD-10-CM | POA: Diagnosis not present

## 2018-01-01 DIAGNOSIS — L600 Ingrowing nail: Secondary | ICD-10-CM | POA: Diagnosis not present

## 2018-01-02 DIAGNOSIS — R609 Edema, unspecified: Secondary | ICD-10-CM | POA: Diagnosis not present

## 2018-01-02 DIAGNOSIS — E119 Type 2 diabetes mellitus without complications: Secondary | ICD-10-CM | POA: Diagnosis not present

## 2018-01-02 DIAGNOSIS — L03031 Cellulitis of right toe: Secondary | ICD-10-CM | POA: Diagnosis not present

## 2018-01-04 DIAGNOSIS — N182 Chronic kidney disease, stage 2 (mild): Secondary | ICD-10-CM | POA: Diagnosis not present

## 2018-01-04 DIAGNOSIS — Z9181 History of falling: Secondary | ICD-10-CM | POA: Diagnosis not present

## 2018-01-04 DIAGNOSIS — E785 Hyperlipidemia, unspecified: Secondary | ICD-10-CM | POA: Diagnosis not present

## 2018-01-04 DIAGNOSIS — I5030 Unspecified diastolic (congestive) heart failure: Secondary | ICD-10-CM | POA: Diagnosis not present

## 2018-01-04 DIAGNOSIS — L600 Ingrowing nail: Secondary | ICD-10-CM | POA: Diagnosis not present

## 2018-01-04 DIAGNOSIS — E1122 Type 2 diabetes mellitus with diabetic chronic kidney disease: Secondary | ICD-10-CM | POA: Diagnosis not present

## 2018-01-04 DIAGNOSIS — M109 Gout, unspecified: Secondary | ICD-10-CM | POA: Diagnosis not present

## 2018-01-04 DIAGNOSIS — L03031 Cellulitis of right toe: Secondary | ICD-10-CM | POA: Diagnosis not present

## 2018-01-04 DIAGNOSIS — J449 Chronic obstructive pulmonary disease, unspecified: Secondary | ICD-10-CM | POA: Diagnosis not present

## 2018-01-04 DIAGNOSIS — I130 Hypertensive heart and chronic kidney disease with heart failure and stage 1 through stage 4 chronic kidney disease, or unspecified chronic kidney disease: Secondary | ICD-10-CM | POA: Diagnosis not present

## 2018-01-11 DIAGNOSIS — E785 Hyperlipidemia, unspecified: Secondary | ICD-10-CM | POA: Diagnosis not present

## 2018-01-11 DIAGNOSIS — Z9981 Dependence on supplemental oxygen: Secondary | ICD-10-CM | POA: Diagnosis not present

## 2018-01-11 DIAGNOSIS — L03031 Cellulitis of right toe: Secondary | ICD-10-CM | POA: Diagnosis not present

## 2018-01-11 DIAGNOSIS — N182 Chronic kidney disease, stage 2 (mild): Secondary | ICD-10-CM | POA: Diagnosis not present

## 2018-01-11 DIAGNOSIS — I5030 Unspecified diastolic (congestive) heart failure: Secondary | ICD-10-CM | POA: Diagnosis not present

## 2018-01-11 DIAGNOSIS — I130 Hypertensive heart and chronic kidney disease with heart failure and stage 1 through stage 4 chronic kidney disease, or unspecified chronic kidney disease: Secondary | ICD-10-CM | POA: Diagnosis not present

## 2018-01-11 DIAGNOSIS — E1122 Type 2 diabetes mellitus with diabetic chronic kidney disease: Secondary | ICD-10-CM | POA: Diagnosis not present

## 2018-01-11 DIAGNOSIS — M109 Gout, unspecified: Secondary | ICD-10-CM | POA: Diagnosis not present

## 2018-01-11 DIAGNOSIS — Z9181 History of falling: Secondary | ICD-10-CM | POA: Diagnosis not present

## 2018-01-11 DIAGNOSIS — J449 Chronic obstructive pulmonary disease, unspecified: Secondary | ICD-10-CM | POA: Diagnosis not present

## 2018-01-12 DIAGNOSIS — Z9181 History of falling: Secondary | ICD-10-CM | POA: Diagnosis not present

## 2018-01-12 DIAGNOSIS — E785 Hyperlipidemia, unspecified: Secondary | ICD-10-CM | POA: Diagnosis not present

## 2018-01-12 DIAGNOSIS — L03031 Cellulitis of right toe: Secondary | ICD-10-CM | POA: Diagnosis not present

## 2018-01-12 DIAGNOSIS — E1122 Type 2 diabetes mellitus with diabetic chronic kidney disease: Secondary | ICD-10-CM | POA: Diagnosis not present

## 2018-01-12 DIAGNOSIS — I130 Hypertensive heart and chronic kidney disease with heart failure and stage 1 through stage 4 chronic kidney disease, or unspecified chronic kidney disease: Secondary | ICD-10-CM | POA: Diagnosis not present

## 2018-01-12 DIAGNOSIS — N182 Chronic kidney disease, stage 2 (mild): Secondary | ICD-10-CM | POA: Diagnosis not present

## 2018-01-12 DIAGNOSIS — J449 Chronic obstructive pulmonary disease, unspecified: Secondary | ICD-10-CM | POA: Diagnosis not present

## 2018-01-12 DIAGNOSIS — I5030 Unspecified diastolic (congestive) heart failure: Secondary | ICD-10-CM | POA: Diagnosis not present

## 2018-01-12 DIAGNOSIS — Z9981 Dependence on supplemental oxygen: Secondary | ICD-10-CM | POA: Diagnosis not present

## 2018-01-12 DIAGNOSIS — M109 Gout, unspecified: Secondary | ICD-10-CM | POA: Diagnosis not present

## 2018-01-16 DIAGNOSIS — L03031 Cellulitis of right toe: Secondary | ICD-10-CM | POA: Diagnosis not present

## 2018-01-16 DIAGNOSIS — E785 Hyperlipidemia, unspecified: Secondary | ICD-10-CM | POA: Diagnosis not present

## 2018-01-16 DIAGNOSIS — M109 Gout, unspecified: Secondary | ICD-10-CM | POA: Diagnosis not present

## 2018-01-16 DIAGNOSIS — I130 Hypertensive heart and chronic kidney disease with heart failure and stage 1 through stage 4 chronic kidney disease, or unspecified chronic kidney disease: Secondary | ICD-10-CM | POA: Diagnosis not present

## 2018-01-16 DIAGNOSIS — N182 Chronic kidney disease, stage 2 (mild): Secondary | ICD-10-CM | POA: Diagnosis not present

## 2018-01-16 DIAGNOSIS — J449 Chronic obstructive pulmonary disease, unspecified: Secondary | ICD-10-CM | POA: Diagnosis not present

## 2018-01-16 DIAGNOSIS — E1122 Type 2 diabetes mellitus with diabetic chronic kidney disease: Secondary | ICD-10-CM | POA: Diagnosis not present

## 2018-01-16 DIAGNOSIS — I5030 Unspecified diastolic (congestive) heart failure: Secondary | ICD-10-CM | POA: Diagnosis not present

## 2018-01-16 DIAGNOSIS — Z9181 History of falling: Secondary | ICD-10-CM | POA: Diagnosis not present

## 2018-01-16 DIAGNOSIS — Z9981 Dependence on supplemental oxygen: Secondary | ICD-10-CM | POA: Diagnosis not present

## 2018-01-17 DIAGNOSIS — M109 Gout, unspecified: Secondary | ICD-10-CM | POA: Diagnosis not present

## 2018-01-17 DIAGNOSIS — E1122 Type 2 diabetes mellitus with diabetic chronic kidney disease: Secondary | ICD-10-CM | POA: Diagnosis not present

## 2018-01-17 DIAGNOSIS — I130 Hypertensive heart and chronic kidney disease with heart failure and stage 1 through stage 4 chronic kidney disease, or unspecified chronic kidney disease: Secondary | ICD-10-CM | POA: Diagnosis not present

## 2018-01-17 DIAGNOSIS — J449 Chronic obstructive pulmonary disease, unspecified: Secondary | ICD-10-CM | POA: Diagnosis not present

## 2018-01-17 DIAGNOSIS — N182 Chronic kidney disease, stage 2 (mild): Secondary | ICD-10-CM | POA: Diagnosis not present

## 2018-01-17 DIAGNOSIS — E785 Hyperlipidemia, unspecified: Secondary | ICD-10-CM | POA: Diagnosis not present

## 2018-01-17 DIAGNOSIS — Z9181 History of falling: Secondary | ICD-10-CM | POA: Diagnosis not present

## 2018-01-17 DIAGNOSIS — I5030 Unspecified diastolic (congestive) heart failure: Secondary | ICD-10-CM | POA: Diagnosis not present

## 2018-01-17 DIAGNOSIS — Z9981 Dependence on supplemental oxygen: Secondary | ICD-10-CM | POA: Diagnosis not present

## 2018-01-17 DIAGNOSIS — L03031 Cellulitis of right toe: Secondary | ICD-10-CM | POA: Diagnosis not present

## 2018-01-18 DIAGNOSIS — L03031 Cellulitis of right toe: Secondary | ICD-10-CM | POA: Diagnosis not present

## 2018-01-18 DIAGNOSIS — Z9981 Dependence on supplemental oxygen: Secondary | ICD-10-CM | POA: Diagnosis not present

## 2018-01-18 DIAGNOSIS — E785 Hyperlipidemia, unspecified: Secondary | ICD-10-CM | POA: Diagnosis not present

## 2018-01-18 DIAGNOSIS — J449 Chronic obstructive pulmonary disease, unspecified: Secondary | ICD-10-CM | POA: Diagnosis not present

## 2018-01-18 DIAGNOSIS — M109 Gout, unspecified: Secondary | ICD-10-CM | POA: Diagnosis not present

## 2018-01-18 DIAGNOSIS — I5030 Unspecified diastolic (congestive) heart failure: Secondary | ICD-10-CM | POA: Diagnosis not present

## 2018-01-18 DIAGNOSIS — N182 Chronic kidney disease, stage 2 (mild): Secondary | ICD-10-CM | POA: Diagnosis not present

## 2018-01-18 DIAGNOSIS — E1122 Type 2 diabetes mellitus with diabetic chronic kidney disease: Secondary | ICD-10-CM | POA: Diagnosis not present

## 2018-01-18 DIAGNOSIS — Z9181 History of falling: Secondary | ICD-10-CM | POA: Diagnosis not present

## 2018-01-18 DIAGNOSIS — I130 Hypertensive heart and chronic kidney disease with heart failure and stage 1 through stage 4 chronic kidney disease, or unspecified chronic kidney disease: Secondary | ICD-10-CM | POA: Diagnosis not present

## 2018-01-19 DIAGNOSIS — I5030 Unspecified diastolic (congestive) heart failure: Secondary | ICD-10-CM | POA: Diagnosis not present

## 2018-01-19 DIAGNOSIS — M109 Gout, unspecified: Secondary | ICD-10-CM | POA: Diagnosis not present

## 2018-01-19 DIAGNOSIS — J449 Chronic obstructive pulmonary disease, unspecified: Secondary | ICD-10-CM | POA: Diagnosis not present

## 2018-01-19 DIAGNOSIS — Z9181 History of falling: Secondary | ICD-10-CM | POA: Diagnosis not present

## 2018-01-19 DIAGNOSIS — Z9981 Dependence on supplemental oxygen: Secondary | ICD-10-CM | POA: Diagnosis not present

## 2018-01-19 DIAGNOSIS — E785 Hyperlipidemia, unspecified: Secondary | ICD-10-CM | POA: Diagnosis not present

## 2018-01-19 DIAGNOSIS — N182 Chronic kidney disease, stage 2 (mild): Secondary | ICD-10-CM | POA: Diagnosis not present

## 2018-01-19 DIAGNOSIS — L03031 Cellulitis of right toe: Secondary | ICD-10-CM | POA: Diagnosis not present

## 2018-01-19 DIAGNOSIS — E1122 Type 2 diabetes mellitus with diabetic chronic kidney disease: Secondary | ICD-10-CM | POA: Diagnosis not present

## 2018-01-19 DIAGNOSIS — I130 Hypertensive heart and chronic kidney disease with heart failure and stage 1 through stage 4 chronic kidney disease, or unspecified chronic kidney disease: Secondary | ICD-10-CM | POA: Diagnosis not present

## 2018-01-22 DIAGNOSIS — M109 Gout, unspecified: Secondary | ICD-10-CM | POA: Diagnosis not present

## 2018-01-22 DIAGNOSIS — E1122 Type 2 diabetes mellitus with diabetic chronic kidney disease: Secondary | ICD-10-CM | POA: Diagnosis not present

## 2018-01-22 DIAGNOSIS — J441 Chronic obstructive pulmonary disease with (acute) exacerbation: Secondary | ICD-10-CM | POA: Diagnosis not present

## 2018-01-22 DIAGNOSIS — J449 Chronic obstructive pulmonary disease, unspecified: Secondary | ICD-10-CM | POA: Diagnosis not present

## 2018-01-22 DIAGNOSIS — E785 Hyperlipidemia, unspecified: Secondary | ICD-10-CM | POA: Diagnosis not present

## 2018-01-22 DIAGNOSIS — I1310 Hypertensive heart and chronic kidney disease without heart failure, with stage 1 through stage 4 chronic kidney disease, or unspecified chronic kidney disease: Secondary | ICD-10-CM | POA: Diagnosis not present

## 2018-01-23 DIAGNOSIS — E785 Hyperlipidemia, unspecified: Secondary | ICD-10-CM | POA: Diagnosis not present

## 2018-01-23 DIAGNOSIS — Z9981 Dependence on supplemental oxygen: Secondary | ICD-10-CM | POA: Diagnosis not present

## 2018-01-23 DIAGNOSIS — J441 Chronic obstructive pulmonary disease with (acute) exacerbation: Secondary | ICD-10-CM | POA: Diagnosis not present

## 2018-01-23 DIAGNOSIS — N182 Chronic kidney disease, stage 2 (mild): Secondary | ICD-10-CM | POA: Diagnosis not present

## 2018-01-23 DIAGNOSIS — M109 Gout, unspecified: Secondary | ICD-10-CM | POA: Diagnosis not present

## 2018-01-23 DIAGNOSIS — I5030 Unspecified diastolic (congestive) heart failure: Secondary | ICD-10-CM | POA: Diagnosis not present

## 2018-01-23 DIAGNOSIS — I130 Hypertensive heart and chronic kidney disease with heart failure and stage 1 through stage 4 chronic kidney disease, or unspecified chronic kidney disease: Secondary | ICD-10-CM | POA: Diagnosis not present

## 2018-01-23 DIAGNOSIS — L03031 Cellulitis of right toe: Secondary | ICD-10-CM | POA: Diagnosis not present

## 2018-01-23 DIAGNOSIS — E1122 Type 2 diabetes mellitus with diabetic chronic kidney disease: Secondary | ICD-10-CM | POA: Diagnosis not present

## 2018-01-23 DIAGNOSIS — Z9181 History of falling: Secondary | ICD-10-CM | POA: Diagnosis not present

## 2018-01-23 DIAGNOSIS — J449 Chronic obstructive pulmonary disease, unspecified: Secondary | ICD-10-CM | POA: Diagnosis not present

## 2018-01-24 DIAGNOSIS — J441 Chronic obstructive pulmonary disease with (acute) exacerbation: Secondary | ICD-10-CM | POA: Diagnosis not present

## 2018-01-25 DIAGNOSIS — Z9981 Dependence on supplemental oxygen: Secondary | ICD-10-CM | POA: Diagnosis not present

## 2018-01-25 DIAGNOSIS — E785 Hyperlipidemia, unspecified: Secondary | ICD-10-CM | POA: Diagnosis not present

## 2018-01-25 DIAGNOSIS — J449 Chronic obstructive pulmonary disease, unspecified: Secondary | ICD-10-CM | POA: Diagnosis not present

## 2018-01-25 DIAGNOSIS — I5030 Unspecified diastolic (congestive) heart failure: Secondary | ICD-10-CM | POA: Diagnosis not present

## 2018-01-25 DIAGNOSIS — E1122 Type 2 diabetes mellitus with diabetic chronic kidney disease: Secondary | ICD-10-CM | POA: Diagnosis not present

## 2018-01-25 DIAGNOSIS — N182 Chronic kidney disease, stage 2 (mild): Secondary | ICD-10-CM | POA: Diagnosis not present

## 2018-01-25 DIAGNOSIS — Z9181 History of falling: Secondary | ICD-10-CM | POA: Diagnosis not present

## 2018-01-25 DIAGNOSIS — L03031 Cellulitis of right toe: Secondary | ICD-10-CM | POA: Diagnosis not present

## 2018-01-25 DIAGNOSIS — I130 Hypertensive heart and chronic kidney disease with heart failure and stage 1 through stage 4 chronic kidney disease, or unspecified chronic kidney disease: Secondary | ICD-10-CM | POA: Diagnosis not present

## 2018-01-25 DIAGNOSIS — M109 Gout, unspecified: Secondary | ICD-10-CM | POA: Diagnosis not present

## 2018-01-26 DIAGNOSIS — M109 Gout, unspecified: Secondary | ICD-10-CM | POA: Diagnosis not present

## 2018-01-26 DIAGNOSIS — N182 Chronic kidney disease, stage 2 (mild): Secondary | ICD-10-CM | POA: Diagnosis not present

## 2018-01-26 DIAGNOSIS — I5030 Unspecified diastolic (congestive) heart failure: Secondary | ICD-10-CM | POA: Diagnosis not present

## 2018-01-26 DIAGNOSIS — E1122 Type 2 diabetes mellitus with diabetic chronic kidney disease: Secondary | ICD-10-CM | POA: Diagnosis not present

## 2018-01-26 DIAGNOSIS — Z9981 Dependence on supplemental oxygen: Secondary | ICD-10-CM | POA: Diagnosis not present

## 2018-01-26 DIAGNOSIS — I130 Hypertensive heart and chronic kidney disease with heart failure and stage 1 through stage 4 chronic kidney disease, or unspecified chronic kidney disease: Secondary | ICD-10-CM | POA: Diagnosis not present

## 2018-01-26 DIAGNOSIS — L03031 Cellulitis of right toe: Secondary | ICD-10-CM | POA: Diagnosis not present

## 2018-01-26 DIAGNOSIS — Z9181 History of falling: Secondary | ICD-10-CM | POA: Diagnosis not present

## 2018-01-26 DIAGNOSIS — J449 Chronic obstructive pulmonary disease, unspecified: Secondary | ICD-10-CM | POA: Diagnosis not present

## 2018-01-26 DIAGNOSIS — E785 Hyperlipidemia, unspecified: Secondary | ICD-10-CM | POA: Diagnosis not present

## 2018-01-29 DIAGNOSIS — I130 Hypertensive heart and chronic kidney disease with heart failure and stage 1 through stage 4 chronic kidney disease, or unspecified chronic kidney disease: Secondary | ICD-10-CM | POA: Diagnosis not present

## 2018-01-29 DIAGNOSIS — Z9981 Dependence on supplemental oxygen: Secondary | ICD-10-CM | POA: Diagnosis not present

## 2018-01-29 DIAGNOSIS — N182 Chronic kidney disease, stage 2 (mild): Secondary | ICD-10-CM | POA: Diagnosis not present

## 2018-01-29 DIAGNOSIS — J449 Chronic obstructive pulmonary disease, unspecified: Secondary | ICD-10-CM | POA: Diagnosis not present

## 2018-01-29 DIAGNOSIS — M109 Gout, unspecified: Secondary | ICD-10-CM | POA: Diagnosis not present

## 2018-01-29 DIAGNOSIS — E785 Hyperlipidemia, unspecified: Secondary | ICD-10-CM | POA: Diagnosis not present

## 2018-01-29 DIAGNOSIS — I5030 Unspecified diastolic (congestive) heart failure: Secondary | ICD-10-CM | POA: Diagnosis not present

## 2018-01-29 DIAGNOSIS — L03031 Cellulitis of right toe: Secondary | ICD-10-CM | POA: Diagnosis not present

## 2018-01-29 DIAGNOSIS — E1122 Type 2 diabetes mellitus with diabetic chronic kidney disease: Secondary | ICD-10-CM | POA: Diagnosis not present

## 2018-01-29 DIAGNOSIS — Z9181 History of falling: Secondary | ICD-10-CM | POA: Diagnosis not present

## 2018-01-30 DIAGNOSIS — E875 Hyperkalemia: Secondary | ICD-10-CM | POA: Diagnosis not present

## 2018-01-30 DIAGNOSIS — S81801A Unspecified open wound, right lower leg, initial encounter: Secondary | ICD-10-CM | POA: Diagnosis not present

## 2018-01-30 DIAGNOSIS — E119 Type 2 diabetes mellitus without complications: Secondary | ICD-10-CM | POA: Diagnosis not present

## 2018-01-30 DIAGNOSIS — J441 Chronic obstructive pulmonary disease with (acute) exacerbation: Secondary | ICD-10-CM | POA: Diagnosis not present

## 2018-01-30 DIAGNOSIS — I1310 Hypertensive heart and chronic kidney disease without heart failure, with stage 1 through stage 4 chronic kidney disease, or unspecified chronic kidney disease: Secondary | ICD-10-CM | POA: Diagnosis not present

## 2018-02-01 DIAGNOSIS — L03031 Cellulitis of right toe: Secondary | ICD-10-CM | POA: Diagnosis not present

## 2018-02-01 DIAGNOSIS — N182 Chronic kidney disease, stage 2 (mild): Secondary | ICD-10-CM | POA: Diagnosis not present

## 2018-02-01 DIAGNOSIS — J449 Chronic obstructive pulmonary disease, unspecified: Secondary | ICD-10-CM | POA: Diagnosis not present

## 2018-02-01 DIAGNOSIS — Z9981 Dependence on supplemental oxygen: Secondary | ICD-10-CM | POA: Diagnosis not present

## 2018-02-01 DIAGNOSIS — M109 Gout, unspecified: Secondary | ICD-10-CM | POA: Diagnosis not present

## 2018-02-01 DIAGNOSIS — I5030 Unspecified diastolic (congestive) heart failure: Secondary | ICD-10-CM | POA: Diagnosis not present

## 2018-02-01 DIAGNOSIS — E785 Hyperlipidemia, unspecified: Secondary | ICD-10-CM | POA: Diagnosis not present

## 2018-02-01 DIAGNOSIS — Z9181 History of falling: Secondary | ICD-10-CM | POA: Diagnosis not present

## 2018-02-01 DIAGNOSIS — E1122 Type 2 diabetes mellitus with diabetic chronic kidney disease: Secondary | ICD-10-CM | POA: Diagnosis not present

## 2018-02-01 DIAGNOSIS — I130 Hypertensive heart and chronic kidney disease with heart failure and stage 1 through stage 4 chronic kidney disease, or unspecified chronic kidney disease: Secondary | ICD-10-CM | POA: Diagnosis not present

## 2018-02-06 DIAGNOSIS — I5030 Unspecified diastolic (congestive) heart failure: Secondary | ICD-10-CM | POA: Diagnosis not present

## 2018-02-06 DIAGNOSIS — N182 Chronic kidney disease, stage 2 (mild): Secondary | ICD-10-CM | POA: Diagnosis not present

## 2018-02-06 DIAGNOSIS — E1122 Type 2 diabetes mellitus with diabetic chronic kidney disease: Secondary | ICD-10-CM | POA: Diagnosis not present

## 2018-02-06 DIAGNOSIS — L03031 Cellulitis of right toe: Secondary | ICD-10-CM | POA: Diagnosis not present

## 2018-02-06 DIAGNOSIS — E785 Hyperlipidemia, unspecified: Secondary | ICD-10-CM | POA: Diagnosis not present

## 2018-02-06 DIAGNOSIS — Z9981 Dependence on supplemental oxygen: Secondary | ICD-10-CM | POA: Diagnosis not present

## 2018-02-06 DIAGNOSIS — I130 Hypertensive heart and chronic kidney disease with heart failure and stage 1 through stage 4 chronic kidney disease, or unspecified chronic kidney disease: Secondary | ICD-10-CM | POA: Diagnosis not present

## 2018-02-06 DIAGNOSIS — Z9181 History of falling: Secondary | ICD-10-CM | POA: Diagnosis not present

## 2018-02-06 DIAGNOSIS — J449 Chronic obstructive pulmonary disease, unspecified: Secondary | ICD-10-CM | POA: Diagnosis not present

## 2018-02-06 DIAGNOSIS — M109 Gout, unspecified: Secondary | ICD-10-CM | POA: Diagnosis not present

## 2018-02-09 DIAGNOSIS — E1122 Type 2 diabetes mellitus with diabetic chronic kidney disease: Secondary | ICD-10-CM | POA: Diagnosis not present

## 2018-02-09 DIAGNOSIS — L03031 Cellulitis of right toe: Secondary | ICD-10-CM | POA: Diagnosis not present

## 2018-02-09 DIAGNOSIS — M109 Gout, unspecified: Secondary | ICD-10-CM | POA: Diagnosis not present

## 2018-02-09 DIAGNOSIS — Z9181 History of falling: Secondary | ICD-10-CM | POA: Diagnosis not present

## 2018-02-09 DIAGNOSIS — N182 Chronic kidney disease, stage 2 (mild): Secondary | ICD-10-CM | POA: Diagnosis not present

## 2018-02-09 DIAGNOSIS — I5030 Unspecified diastolic (congestive) heart failure: Secondary | ICD-10-CM | POA: Diagnosis not present

## 2018-02-09 DIAGNOSIS — J449 Chronic obstructive pulmonary disease, unspecified: Secondary | ICD-10-CM | POA: Diagnosis not present

## 2018-02-09 DIAGNOSIS — E785 Hyperlipidemia, unspecified: Secondary | ICD-10-CM | POA: Diagnosis not present

## 2018-02-09 DIAGNOSIS — I130 Hypertensive heart and chronic kidney disease with heart failure and stage 1 through stage 4 chronic kidney disease, or unspecified chronic kidney disease: Secondary | ICD-10-CM | POA: Diagnosis not present

## 2018-02-09 DIAGNOSIS — Z9981 Dependence on supplemental oxygen: Secondary | ICD-10-CM | POA: Diagnosis not present

## 2018-02-13 DIAGNOSIS — E1122 Type 2 diabetes mellitus with diabetic chronic kidney disease: Secondary | ICD-10-CM | POA: Diagnosis not present

## 2018-02-13 DIAGNOSIS — N182 Chronic kidney disease, stage 2 (mild): Secondary | ICD-10-CM | POA: Diagnosis not present

## 2018-02-13 DIAGNOSIS — Z9181 History of falling: Secondary | ICD-10-CM | POA: Diagnosis not present

## 2018-02-13 DIAGNOSIS — M109 Gout, unspecified: Secondary | ICD-10-CM | POA: Diagnosis not present

## 2018-02-13 DIAGNOSIS — I130 Hypertensive heart and chronic kidney disease with heart failure and stage 1 through stage 4 chronic kidney disease, or unspecified chronic kidney disease: Secondary | ICD-10-CM | POA: Diagnosis not present

## 2018-02-13 DIAGNOSIS — L03031 Cellulitis of right toe: Secondary | ICD-10-CM | POA: Diagnosis not present

## 2018-02-13 DIAGNOSIS — I5030 Unspecified diastolic (congestive) heart failure: Secondary | ICD-10-CM | POA: Diagnosis not present

## 2018-02-13 DIAGNOSIS — J449 Chronic obstructive pulmonary disease, unspecified: Secondary | ICD-10-CM | POA: Diagnosis not present

## 2018-02-13 DIAGNOSIS — E785 Hyperlipidemia, unspecified: Secondary | ICD-10-CM | POA: Diagnosis not present

## 2018-02-13 DIAGNOSIS — S81801A Unspecified open wound, right lower leg, initial encounter: Secondary | ICD-10-CM | POA: Diagnosis not present

## 2018-02-14 DIAGNOSIS — I5030 Unspecified diastolic (congestive) heart failure: Secondary | ICD-10-CM | POA: Diagnosis not present

## 2018-02-14 DIAGNOSIS — L03031 Cellulitis of right toe: Secondary | ICD-10-CM | POA: Diagnosis not present

## 2018-02-14 DIAGNOSIS — S81801A Unspecified open wound, right lower leg, initial encounter: Secondary | ICD-10-CM | POA: Diagnosis not present

## 2018-02-14 DIAGNOSIS — J449 Chronic obstructive pulmonary disease, unspecified: Secondary | ICD-10-CM | POA: Diagnosis not present

## 2018-02-14 DIAGNOSIS — N182 Chronic kidney disease, stage 2 (mild): Secondary | ICD-10-CM | POA: Diagnosis not present

## 2018-02-14 DIAGNOSIS — Z9181 History of falling: Secondary | ICD-10-CM | POA: Diagnosis not present

## 2018-02-14 DIAGNOSIS — I130 Hypertensive heart and chronic kidney disease with heart failure and stage 1 through stage 4 chronic kidney disease, or unspecified chronic kidney disease: Secondary | ICD-10-CM | POA: Diagnosis not present

## 2018-02-14 DIAGNOSIS — E1122 Type 2 diabetes mellitus with diabetic chronic kidney disease: Secondary | ICD-10-CM | POA: Diagnosis not present

## 2018-02-14 DIAGNOSIS — M109 Gout, unspecified: Secondary | ICD-10-CM | POA: Diagnosis not present

## 2018-02-14 DIAGNOSIS — E785 Hyperlipidemia, unspecified: Secondary | ICD-10-CM | POA: Diagnosis not present

## 2018-02-15 DIAGNOSIS — R69 Illness, unspecified: Secondary | ICD-10-CM | POA: Diagnosis not present

## 2018-02-16 DIAGNOSIS — M109 Gout, unspecified: Secondary | ICD-10-CM | POA: Diagnosis not present

## 2018-02-16 DIAGNOSIS — S81801A Unspecified open wound, right lower leg, initial encounter: Secondary | ICD-10-CM | POA: Diagnosis not present

## 2018-02-16 DIAGNOSIS — N182 Chronic kidney disease, stage 2 (mild): Secondary | ICD-10-CM | POA: Diagnosis not present

## 2018-02-16 DIAGNOSIS — I130 Hypertensive heart and chronic kidney disease with heart failure and stage 1 through stage 4 chronic kidney disease, or unspecified chronic kidney disease: Secondary | ICD-10-CM | POA: Diagnosis not present

## 2018-02-16 DIAGNOSIS — Z9181 History of falling: Secondary | ICD-10-CM | POA: Diagnosis not present

## 2018-02-16 DIAGNOSIS — E1122 Type 2 diabetes mellitus with diabetic chronic kidney disease: Secondary | ICD-10-CM | POA: Diagnosis not present

## 2018-02-16 DIAGNOSIS — L03031 Cellulitis of right toe: Secondary | ICD-10-CM | POA: Diagnosis not present

## 2018-02-16 DIAGNOSIS — I5030 Unspecified diastolic (congestive) heart failure: Secondary | ICD-10-CM | POA: Diagnosis not present

## 2018-02-16 DIAGNOSIS — E785 Hyperlipidemia, unspecified: Secondary | ICD-10-CM | POA: Diagnosis not present

## 2018-02-16 DIAGNOSIS — J449 Chronic obstructive pulmonary disease, unspecified: Secondary | ICD-10-CM | POA: Diagnosis not present

## 2018-02-18 DIAGNOSIS — J449 Chronic obstructive pulmonary disease, unspecified: Secondary | ICD-10-CM | POA: Diagnosis not present

## 2018-02-19 DIAGNOSIS — I130 Hypertensive heart and chronic kidney disease with heart failure and stage 1 through stage 4 chronic kidney disease, or unspecified chronic kidney disease: Secondary | ICD-10-CM | POA: Diagnosis not present

## 2018-02-19 DIAGNOSIS — J449 Chronic obstructive pulmonary disease, unspecified: Secondary | ICD-10-CM | POA: Diagnosis not present

## 2018-02-19 DIAGNOSIS — Z9181 History of falling: Secondary | ICD-10-CM | POA: Diagnosis not present

## 2018-02-19 DIAGNOSIS — M109 Gout, unspecified: Secondary | ICD-10-CM | POA: Diagnosis not present

## 2018-02-19 DIAGNOSIS — L03031 Cellulitis of right toe: Secondary | ICD-10-CM | POA: Diagnosis not present

## 2018-02-19 DIAGNOSIS — E1122 Type 2 diabetes mellitus with diabetic chronic kidney disease: Secondary | ICD-10-CM | POA: Diagnosis not present

## 2018-02-19 DIAGNOSIS — N182 Chronic kidney disease, stage 2 (mild): Secondary | ICD-10-CM | POA: Diagnosis not present

## 2018-02-19 DIAGNOSIS — I5030 Unspecified diastolic (congestive) heart failure: Secondary | ICD-10-CM | POA: Diagnosis not present

## 2018-02-19 DIAGNOSIS — S81801A Unspecified open wound, right lower leg, initial encounter: Secondary | ICD-10-CM | POA: Diagnosis not present

## 2018-02-19 DIAGNOSIS — E785 Hyperlipidemia, unspecified: Secondary | ICD-10-CM | POA: Diagnosis not present

## 2018-02-21 DIAGNOSIS — I130 Hypertensive heart and chronic kidney disease with heart failure and stage 1 through stage 4 chronic kidney disease, or unspecified chronic kidney disease: Secondary | ICD-10-CM | POA: Diagnosis not present

## 2018-02-21 DIAGNOSIS — J449 Chronic obstructive pulmonary disease, unspecified: Secondary | ICD-10-CM | POA: Diagnosis not present

## 2018-02-21 DIAGNOSIS — S81801A Unspecified open wound, right lower leg, initial encounter: Secondary | ICD-10-CM | POA: Diagnosis not present

## 2018-02-21 DIAGNOSIS — M109 Gout, unspecified: Secondary | ICD-10-CM | POA: Diagnosis not present

## 2018-02-21 DIAGNOSIS — N182 Chronic kidney disease, stage 2 (mild): Secondary | ICD-10-CM | POA: Diagnosis not present

## 2018-02-21 DIAGNOSIS — E785 Hyperlipidemia, unspecified: Secondary | ICD-10-CM | POA: Diagnosis not present

## 2018-02-21 DIAGNOSIS — L03031 Cellulitis of right toe: Secondary | ICD-10-CM | POA: Diagnosis not present

## 2018-02-21 DIAGNOSIS — I5030 Unspecified diastolic (congestive) heart failure: Secondary | ICD-10-CM | POA: Diagnosis not present

## 2018-02-21 DIAGNOSIS — Z9181 History of falling: Secondary | ICD-10-CM | POA: Diagnosis not present

## 2018-02-21 DIAGNOSIS — E1122 Type 2 diabetes mellitus with diabetic chronic kidney disease: Secondary | ICD-10-CM | POA: Diagnosis not present

## 2018-02-22 DIAGNOSIS — J449 Chronic obstructive pulmonary disease, unspecified: Secondary | ICD-10-CM | POA: Diagnosis not present

## 2018-02-22 DIAGNOSIS — N182 Chronic kidney disease, stage 2 (mild): Secondary | ICD-10-CM | POA: Diagnosis not present

## 2018-02-22 DIAGNOSIS — E1122 Type 2 diabetes mellitus with diabetic chronic kidney disease: Secondary | ICD-10-CM | POA: Diagnosis not present

## 2018-02-22 DIAGNOSIS — I130 Hypertensive heart and chronic kidney disease with heart failure and stage 1 through stage 4 chronic kidney disease, or unspecified chronic kidney disease: Secondary | ICD-10-CM | POA: Diagnosis not present

## 2018-02-22 DIAGNOSIS — M109 Gout, unspecified: Secondary | ICD-10-CM | POA: Diagnosis not present

## 2018-02-22 DIAGNOSIS — Z9181 History of falling: Secondary | ICD-10-CM | POA: Diagnosis not present

## 2018-02-22 DIAGNOSIS — L03031 Cellulitis of right toe: Secondary | ICD-10-CM | POA: Diagnosis not present

## 2018-02-22 DIAGNOSIS — I5030 Unspecified diastolic (congestive) heart failure: Secondary | ICD-10-CM | POA: Diagnosis not present

## 2018-02-22 DIAGNOSIS — S81801A Unspecified open wound, right lower leg, initial encounter: Secondary | ICD-10-CM | POA: Diagnosis not present

## 2018-02-22 DIAGNOSIS — E785 Hyperlipidemia, unspecified: Secondary | ICD-10-CM | POA: Diagnosis not present

## 2018-02-26 DIAGNOSIS — E1122 Type 2 diabetes mellitus with diabetic chronic kidney disease: Secondary | ICD-10-CM | POA: Diagnosis not present

## 2018-02-26 DIAGNOSIS — N182 Chronic kidney disease, stage 2 (mild): Secondary | ICD-10-CM | POA: Diagnosis not present

## 2018-02-26 DIAGNOSIS — L03031 Cellulitis of right toe: Secondary | ICD-10-CM | POA: Diagnosis not present

## 2018-02-26 DIAGNOSIS — S81801A Unspecified open wound, right lower leg, initial encounter: Secondary | ICD-10-CM | POA: Diagnosis not present

## 2018-02-26 DIAGNOSIS — M109 Gout, unspecified: Secondary | ICD-10-CM | POA: Diagnosis not present

## 2018-02-26 DIAGNOSIS — E785 Hyperlipidemia, unspecified: Secondary | ICD-10-CM | POA: Diagnosis not present

## 2018-02-26 DIAGNOSIS — I130 Hypertensive heart and chronic kidney disease with heart failure and stage 1 through stage 4 chronic kidney disease, or unspecified chronic kidney disease: Secondary | ICD-10-CM | POA: Diagnosis not present

## 2018-02-26 DIAGNOSIS — I5030 Unspecified diastolic (congestive) heart failure: Secondary | ICD-10-CM | POA: Diagnosis not present

## 2018-02-26 DIAGNOSIS — Z9181 History of falling: Secondary | ICD-10-CM | POA: Diagnosis not present

## 2018-02-26 DIAGNOSIS — J449 Chronic obstructive pulmonary disease, unspecified: Secondary | ICD-10-CM | POA: Diagnosis not present

## 2018-02-27 DIAGNOSIS — L03031 Cellulitis of right toe: Secondary | ICD-10-CM | POA: Diagnosis not present

## 2018-02-27 DIAGNOSIS — S81801A Unspecified open wound, right lower leg, initial encounter: Secondary | ICD-10-CM | POA: Diagnosis not present

## 2018-02-27 DIAGNOSIS — E785 Hyperlipidemia, unspecified: Secondary | ICD-10-CM | POA: Diagnosis not present

## 2018-02-27 DIAGNOSIS — I5030 Unspecified diastolic (congestive) heart failure: Secondary | ICD-10-CM | POA: Diagnosis not present

## 2018-02-27 DIAGNOSIS — E1122 Type 2 diabetes mellitus with diabetic chronic kidney disease: Secondary | ICD-10-CM | POA: Diagnosis not present

## 2018-02-27 DIAGNOSIS — Z9181 History of falling: Secondary | ICD-10-CM | POA: Diagnosis not present

## 2018-02-27 DIAGNOSIS — M109 Gout, unspecified: Secondary | ICD-10-CM | POA: Diagnosis not present

## 2018-02-27 DIAGNOSIS — J449 Chronic obstructive pulmonary disease, unspecified: Secondary | ICD-10-CM | POA: Diagnosis not present

## 2018-02-27 DIAGNOSIS — N182 Chronic kidney disease, stage 2 (mild): Secondary | ICD-10-CM | POA: Diagnosis not present

## 2018-02-27 DIAGNOSIS — I130 Hypertensive heart and chronic kidney disease with heart failure and stage 1 through stage 4 chronic kidney disease, or unspecified chronic kidney disease: Secondary | ICD-10-CM | POA: Diagnosis not present

## 2018-03-01 DIAGNOSIS — L03031 Cellulitis of right toe: Secondary | ICD-10-CM | POA: Diagnosis not present

## 2018-03-01 DIAGNOSIS — S81801A Unspecified open wound, right lower leg, initial encounter: Secondary | ICD-10-CM | POA: Diagnosis not present

## 2018-03-01 DIAGNOSIS — Z9181 History of falling: Secondary | ICD-10-CM | POA: Diagnosis not present

## 2018-03-01 DIAGNOSIS — J449 Chronic obstructive pulmonary disease, unspecified: Secondary | ICD-10-CM | POA: Diagnosis not present

## 2018-03-01 DIAGNOSIS — I130 Hypertensive heart and chronic kidney disease with heart failure and stage 1 through stage 4 chronic kidney disease, or unspecified chronic kidney disease: Secondary | ICD-10-CM | POA: Diagnosis not present

## 2018-03-01 DIAGNOSIS — I5030 Unspecified diastolic (congestive) heart failure: Secondary | ICD-10-CM | POA: Diagnosis not present

## 2018-03-01 DIAGNOSIS — E1122 Type 2 diabetes mellitus with diabetic chronic kidney disease: Secondary | ICD-10-CM | POA: Diagnosis not present

## 2018-03-01 DIAGNOSIS — E785 Hyperlipidemia, unspecified: Secondary | ICD-10-CM | POA: Diagnosis not present

## 2018-03-01 DIAGNOSIS — M109 Gout, unspecified: Secondary | ICD-10-CM | POA: Diagnosis not present

## 2018-03-01 DIAGNOSIS — N182 Chronic kidney disease, stage 2 (mild): Secondary | ICD-10-CM | POA: Diagnosis not present

## 2018-03-05 DIAGNOSIS — E785 Hyperlipidemia, unspecified: Secondary | ICD-10-CM | POA: Diagnosis not present

## 2018-03-05 DIAGNOSIS — S81801A Unspecified open wound, right lower leg, initial encounter: Secondary | ICD-10-CM | POA: Diagnosis not present

## 2018-03-05 DIAGNOSIS — Z9181 History of falling: Secondary | ICD-10-CM | POA: Diagnosis not present

## 2018-03-05 DIAGNOSIS — I130 Hypertensive heart and chronic kidney disease with heart failure and stage 1 through stage 4 chronic kidney disease, or unspecified chronic kidney disease: Secondary | ICD-10-CM | POA: Diagnosis not present

## 2018-03-05 DIAGNOSIS — L03031 Cellulitis of right toe: Secondary | ICD-10-CM | POA: Diagnosis not present

## 2018-03-05 DIAGNOSIS — E1122 Type 2 diabetes mellitus with diabetic chronic kidney disease: Secondary | ICD-10-CM | POA: Diagnosis not present

## 2018-03-05 DIAGNOSIS — I5030 Unspecified diastolic (congestive) heart failure: Secondary | ICD-10-CM | POA: Diagnosis not present

## 2018-03-05 DIAGNOSIS — J449 Chronic obstructive pulmonary disease, unspecified: Secondary | ICD-10-CM | POA: Diagnosis not present

## 2018-03-05 DIAGNOSIS — N182 Chronic kidney disease, stage 2 (mild): Secondary | ICD-10-CM | POA: Diagnosis not present

## 2018-03-05 DIAGNOSIS — M109 Gout, unspecified: Secondary | ICD-10-CM | POA: Diagnosis not present

## 2018-03-06 DIAGNOSIS — Z9181 History of falling: Secondary | ICD-10-CM | POA: Diagnosis not present

## 2018-03-06 DIAGNOSIS — E1122 Type 2 diabetes mellitus with diabetic chronic kidney disease: Secondary | ICD-10-CM | POA: Diagnosis not present

## 2018-03-06 DIAGNOSIS — M109 Gout, unspecified: Secondary | ICD-10-CM | POA: Diagnosis not present

## 2018-03-06 DIAGNOSIS — I5030 Unspecified diastolic (congestive) heart failure: Secondary | ICD-10-CM | POA: Diagnosis not present

## 2018-03-06 DIAGNOSIS — E785 Hyperlipidemia, unspecified: Secondary | ICD-10-CM | POA: Diagnosis not present

## 2018-03-06 DIAGNOSIS — S81801A Unspecified open wound, right lower leg, initial encounter: Secondary | ICD-10-CM | POA: Diagnosis not present

## 2018-03-06 DIAGNOSIS — L03031 Cellulitis of right toe: Secondary | ICD-10-CM | POA: Diagnosis not present

## 2018-03-06 DIAGNOSIS — J449 Chronic obstructive pulmonary disease, unspecified: Secondary | ICD-10-CM | POA: Diagnosis not present

## 2018-03-06 DIAGNOSIS — I130 Hypertensive heart and chronic kidney disease with heart failure and stage 1 through stage 4 chronic kidney disease, or unspecified chronic kidney disease: Secondary | ICD-10-CM | POA: Diagnosis not present

## 2018-03-06 DIAGNOSIS — N182 Chronic kidney disease, stage 2 (mild): Secondary | ICD-10-CM | POA: Diagnosis not present

## 2018-03-08 DIAGNOSIS — E785 Hyperlipidemia, unspecified: Secondary | ICD-10-CM | POA: Diagnosis not present

## 2018-03-08 DIAGNOSIS — S81801A Unspecified open wound, right lower leg, initial encounter: Secondary | ICD-10-CM | POA: Diagnosis not present

## 2018-03-08 DIAGNOSIS — L03031 Cellulitis of right toe: Secondary | ICD-10-CM | POA: Diagnosis not present

## 2018-03-08 DIAGNOSIS — J449 Chronic obstructive pulmonary disease, unspecified: Secondary | ICD-10-CM | POA: Diagnosis not present

## 2018-03-08 DIAGNOSIS — I130 Hypertensive heart and chronic kidney disease with heart failure and stage 1 through stage 4 chronic kidney disease, or unspecified chronic kidney disease: Secondary | ICD-10-CM | POA: Diagnosis not present

## 2018-03-08 DIAGNOSIS — N182 Chronic kidney disease, stage 2 (mild): Secondary | ICD-10-CM | POA: Diagnosis not present

## 2018-03-08 DIAGNOSIS — Z9181 History of falling: Secondary | ICD-10-CM | POA: Diagnosis not present

## 2018-03-08 DIAGNOSIS — E1122 Type 2 diabetes mellitus with diabetic chronic kidney disease: Secondary | ICD-10-CM | POA: Diagnosis not present

## 2018-03-08 DIAGNOSIS — I5030 Unspecified diastolic (congestive) heart failure: Secondary | ICD-10-CM | POA: Diagnosis not present

## 2018-03-08 DIAGNOSIS — M109 Gout, unspecified: Secondary | ICD-10-CM | POA: Diagnosis not present

## 2018-03-12 DIAGNOSIS — L03031 Cellulitis of right toe: Secondary | ICD-10-CM | POA: Diagnosis not present

## 2018-03-12 DIAGNOSIS — E1122 Type 2 diabetes mellitus with diabetic chronic kidney disease: Secondary | ICD-10-CM | POA: Diagnosis not present

## 2018-03-12 DIAGNOSIS — M109 Gout, unspecified: Secondary | ICD-10-CM | POA: Diagnosis not present

## 2018-03-12 DIAGNOSIS — S81801A Unspecified open wound, right lower leg, initial encounter: Secondary | ICD-10-CM | POA: Diagnosis not present

## 2018-03-12 DIAGNOSIS — E785 Hyperlipidemia, unspecified: Secondary | ICD-10-CM | POA: Diagnosis not present

## 2018-03-12 DIAGNOSIS — N182 Chronic kidney disease, stage 2 (mild): Secondary | ICD-10-CM | POA: Diagnosis not present

## 2018-03-12 DIAGNOSIS — J449 Chronic obstructive pulmonary disease, unspecified: Secondary | ICD-10-CM | POA: Diagnosis not present

## 2018-03-12 DIAGNOSIS — I5030 Unspecified diastolic (congestive) heart failure: Secondary | ICD-10-CM | POA: Diagnosis not present

## 2018-03-12 DIAGNOSIS — Z9181 History of falling: Secondary | ICD-10-CM | POA: Diagnosis not present

## 2018-03-12 DIAGNOSIS — I130 Hypertensive heart and chronic kidney disease with heart failure and stage 1 through stage 4 chronic kidney disease, or unspecified chronic kidney disease: Secondary | ICD-10-CM | POA: Diagnosis not present

## 2018-03-13 DIAGNOSIS — M109 Gout, unspecified: Secondary | ICD-10-CM | POA: Diagnosis not present

## 2018-03-13 DIAGNOSIS — J449 Chronic obstructive pulmonary disease, unspecified: Secondary | ICD-10-CM | POA: Diagnosis not present

## 2018-03-13 DIAGNOSIS — Z9181 History of falling: Secondary | ICD-10-CM | POA: Diagnosis not present

## 2018-03-13 DIAGNOSIS — I5030 Unspecified diastolic (congestive) heart failure: Secondary | ICD-10-CM | POA: Diagnosis not present

## 2018-03-13 DIAGNOSIS — E1122 Type 2 diabetes mellitus with diabetic chronic kidney disease: Secondary | ICD-10-CM | POA: Diagnosis not present

## 2018-03-13 DIAGNOSIS — E785 Hyperlipidemia, unspecified: Secondary | ICD-10-CM | POA: Diagnosis not present

## 2018-03-13 DIAGNOSIS — I130 Hypertensive heart and chronic kidney disease with heart failure and stage 1 through stage 4 chronic kidney disease, or unspecified chronic kidney disease: Secondary | ICD-10-CM | POA: Diagnosis not present

## 2018-03-13 DIAGNOSIS — N182 Chronic kidney disease, stage 2 (mild): Secondary | ICD-10-CM | POA: Diagnosis not present

## 2018-03-13 DIAGNOSIS — Z9981 Dependence on supplemental oxygen: Secondary | ICD-10-CM | POA: Diagnosis not present

## 2018-03-13 DIAGNOSIS — S81801A Unspecified open wound, right lower leg, initial encounter: Secondary | ICD-10-CM | POA: Diagnosis not present

## 2018-03-15 DIAGNOSIS — M109 Gout, unspecified: Secondary | ICD-10-CM | POA: Diagnosis not present

## 2018-03-15 DIAGNOSIS — Z9981 Dependence on supplemental oxygen: Secondary | ICD-10-CM | POA: Diagnosis not present

## 2018-03-15 DIAGNOSIS — J449 Chronic obstructive pulmonary disease, unspecified: Secondary | ICD-10-CM | POA: Diagnosis not present

## 2018-03-15 DIAGNOSIS — N182 Chronic kidney disease, stage 2 (mild): Secondary | ICD-10-CM | POA: Diagnosis not present

## 2018-03-15 DIAGNOSIS — I5030 Unspecified diastolic (congestive) heart failure: Secondary | ICD-10-CM | POA: Diagnosis not present

## 2018-03-15 DIAGNOSIS — E1122 Type 2 diabetes mellitus with diabetic chronic kidney disease: Secondary | ICD-10-CM | POA: Diagnosis not present

## 2018-03-15 DIAGNOSIS — E785 Hyperlipidemia, unspecified: Secondary | ICD-10-CM | POA: Diagnosis not present

## 2018-03-15 DIAGNOSIS — S81801A Unspecified open wound, right lower leg, initial encounter: Secondary | ICD-10-CM | POA: Diagnosis not present

## 2018-03-15 DIAGNOSIS — Z9181 History of falling: Secondary | ICD-10-CM | POA: Diagnosis not present

## 2018-03-15 DIAGNOSIS — I130 Hypertensive heart and chronic kidney disease with heart failure and stage 1 through stage 4 chronic kidney disease, or unspecified chronic kidney disease: Secondary | ICD-10-CM | POA: Diagnosis not present

## 2018-03-19 DIAGNOSIS — Z9181 History of falling: Secondary | ICD-10-CM | POA: Diagnosis not present

## 2018-03-19 DIAGNOSIS — M109 Gout, unspecified: Secondary | ICD-10-CM | POA: Diagnosis not present

## 2018-03-19 DIAGNOSIS — S81801A Unspecified open wound, right lower leg, initial encounter: Secondary | ICD-10-CM | POA: Diagnosis not present

## 2018-03-19 DIAGNOSIS — I130 Hypertensive heart and chronic kidney disease with heart failure and stage 1 through stage 4 chronic kidney disease, or unspecified chronic kidney disease: Secondary | ICD-10-CM | POA: Diagnosis not present

## 2018-03-19 DIAGNOSIS — Z9981 Dependence on supplemental oxygen: Secondary | ICD-10-CM | POA: Diagnosis not present

## 2018-03-19 DIAGNOSIS — I5030 Unspecified diastolic (congestive) heart failure: Secondary | ICD-10-CM | POA: Diagnosis not present

## 2018-03-19 DIAGNOSIS — J449 Chronic obstructive pulmonary disease, unspecified: Secondary | ICD-10-CM | POA: Diagnosis not present

## 2018-03-19 DIAGNOSIS — E785 Hyperlipidemia, unspecified: Secondary | ICD-10-CM | POA: Diagnosis not present

## 2018-03-19 DIAGNOSIS — N182 Chronic kidney disease, stage 2 (mild): Secondary | ICD-10-CM | POA: Diagnosis not present

## 2018-03-19 DIAGNOSIS — E1122 Type 2 diabetes mellitus with diabetic chronic kidney disease: Secondary | ICD-10-CM | POA: Diagnosis not present

## 2018-03-20 DIAGNOSIS — M109 Gout, unspecified: Secondary | ICD-10-CM | POA: Diagnosis not present

## 2018-03-20 DIAGNOSIS — I130 Hypertensive heart and chronic kidney disease with heart failure and stage 1 through stage 4 chronic kidney disease, or unspecified chronic kidney disease: Secondary | ICD-10-CM | POA: Diagnosis not present

## 2018-03-20 DIAGNOSIS — Z9181 History of falling: Secondary | ICD-10-CM | POA: Diagnosis not present

## 2018-03-20 DIAGNOSIS — I5030 Unspecified diastolic (congestive) heart failure: Secondary | ICD-10-CM | POA: Diagnosis not present

## 2018-03-20 DIAGNOSIS — S81801A Unspecified open wound, right lower leg, initial encounter: Secondary | ICD-10-CM | POA: Diagnosis not present

## 2018-03-20 DIAGNOSIS — J449 Chronic obstructive pulmonary disease, unspecified: Secondary | ICD-10-CM | POA: Diagnosis not present

## 2018-03-20 DIAGNOSIS — N182 Chronic kidney disease, stage 2 (mild): Secondary | ICD-10-CM | POA: Diagnosis not present

## 2018-03-20 DIAGNOSIS — E785 Hyperlipidemia, unspecified: Secondary | ICD-10-CM | POA: Diagnosis not present

## 2018-03-20 DIAGNOSIS — E1122 Type 2 diabetes mellitus with diabetic chronic kidney disease: Secondary | ICD-10-CM | POA: Diagnosis not present

## 2018-03-20 DIAGNOSIS — Z9981 Dependence on supplemental oxygen: Secondary | ICD-10-CM | POA: Diagnosis not present

## 2018-03-22 DIAGNOSIS — I130 Hypertensive heart and chronic kidney disease with heart failure and stage 1 through stage 4 chronic kidney disease, or unspecified chronic kidney disease: Secondary | ICD-10-CM | POA: Diagnosis not present

## 2018-03-22 DIAGNOSIS — J449 Chronic obstructive pulmonary disease, unspecified: Secondary | ICD-10-CM | POA: Diagnosis not present

## 2018-03-22 DIAGNOSIS — S81801A Unspecified open wound, right lower leg, initial encounter: Secondary | ICD-10-CM | POA: Diagnosis not present

## 2018-03-22 DIAGNOSIS — Z9181 History of falling: Secondary | ICD-10-CM | POA: Diagnosis not present

## 2018-03-22 DIAGNOSIS — Z9981 Dependence on supplemental oxygen: Secondary | ICD-10-CM | POA: Diagnosis not present

## 2018-03-22 DIAGNOSIS — I5030 Unspecified diastolic (congestive) heart failure: Secondary | ICD-10-CM | POA: Diagnosis not present

## 2018-03-22 DIAGNOSIS — E785 Hyperlipidemia, unspecified: Secondary | ICD-10-CM | POA: Diagnosis not present

## 2018-03-22 DIAGNOSIS — E1122 Type 2 diabetes mellitus with diabetic chronic kidney disease: Secondary | ICD-10-CM | POA: Diagnosis not present

## 2018-03-22 DIAGNOSIS — M109 Gout, unspecified: Secondary | ICD-10-CM | POA: Diagnosis not present

## 2018-03-22 DIAGNOSIS — N182 Chronic kidney disease, stage 2 (mild): Secondary | ICD-10-CM | POA: Diagnosis not present

## 2018-03-27 DIAGNOSIS — M109 Gout, unspecified: Secondary | ICD-10-CM | POA: Diagnosis not present

## 2018-03-27 DIAGNOSIS — J449 Chronic obstructive pulmonary disease, unspecified: Secondary | ICD-10-CM | POA: Diagnosis not present

## 2018-03-27 DIAGNOSIS — S81801A Unspecified open wound, right lower leg, initial encounter: Secondary | ICD-10-CM | POA: Diagnosis not present

## 2018-03-27 DIAGNOSIS — I5030 Unspecified diastolic (congestive) heart failure: Secondary | ICD-10-CM | POA: Diagnosis not present

## 2018-03-27 DIAGNOSIS — E1122 Type 2 diabetes mellitus with diabetic chronic kidney disease: Secondary | ICD-10-CM | POA: Diagnosis not present

## 2018-03-27 DIAGNOSIS — Z9981 Dependence on supplemental oxygen: Secondary | ICD-10-CM | POA: Diagnosis not present

## 2018-03-27 DIAGNOSIS — I130 Hypertensive heart and chronic kidney disease with heart failure and stage 1 through stage 4 chronic kidney disease, or unspecified chronic kidney disease: Secondary | ICD-10-CM | POA: Diagnosis not present

## 2018-03-27 DIAGNOSIS — Z9181 History of falling: Secondary | ICD-10-CM | POA: Diagnosis not present

## 2018-03-27 DIAGNOSIS — N182 Chronic kidney disease, stage 2 (mild): Secondary | ICD-10-CM | POA: Diagnosis not present

## 2018-03-27 DIAGNOSIS — E785 Hyperlipidemia, unspecified: Secondary | ICD-10-CM | POA: Diagnosis not present

## 2018-03-29 DIAGNOSIS — E785 Hyperlipidemia, unspecified: Secondary | ICD-10-CM | POA: Diagnosis not present

## 2018-03-29 DIAGNOSIS — N182 Chronic kidney disease, stage 2 (mild): Secondary | ICD-10-CM | POA: Diagnosis not present

## 2018-03-29 DIAGNOSIS — S81801A Unspecified open wound, right lower leg, initial encounter: Secondary | ICD-10-CM | POA: Diagnosis not present

## 2018-03-29 DIAGNOSIS — I130 Hypertensive heart and chronic kidney disease with heart failure and stage 1 through stage 4 chronic kidney disease, or unspecified chronic kidney disease: Secondary | ICD-10-CM | POA: Diagnosis not present

## 2018-03-29 DIAGNOSIS — M109 Gout, unspecified: Secondary | ICD-10-CM | POA: Diagnosis not present

## 2018-03-29 DIAGNOSIS — J449 Chronic obstructive pulmonary disease, unspecified: Secondary | ICD-10-CM | POA: Diagnosis not present

## 2018-03-29 DIAGNOSIS — E1122 Type 2 diabetes mellitus with diabetic chronic kidney disease: Secondary | ICD-10-CM | POA: Diagnosis not present

## 2018-03-29 DIAGNOSIS — I5030 Unspecified diastolic (congestive) heart failure: Secondary | ICD-10-CM | POA: Diagnosis not present

## 2018-03-29 DIAGNOSIS — Z9181 History of falling: Secondary | ICD-10-CM | POA: Diagnosis not present

## 2018-03-29 DIAGNOSIS — Z9981 Dependence on supplemental oxygen: Secondary | ICD-10-CM | POA: Diagnosis not present

## 2018-04-02 DIAGNOSIS — N182 Chronic kidney disease, stage 2 (mild): Secondary | ICD-10-CM | POA: Diagnosis not present

## 2018-04-02 DIAGNOSIS — M109 Gout, unspecified: Secondary | ICD-10-CM | POA: Diagnosis not present

## 2018-04-02 DIAGNOSIS — Z9181 History of falling: Secondary | ICD-10-CM | POA: Diagnosis not present

## 2018-04-02 DIAGNOSIS — Z9981 Dependence on supplemental oxygen: Secondary | ICD-10-CM | POA: Diagnosis not present

## 2018-04-02 DIAGNOSIS — E785 Hyperlipidemia, unspecified: Secondary | ICD-10-CM | POA: Diagnosis not present

## 2018-04-02 DIAGNOSIS — I130 Hypertensive heart and chronic kidney disease with heart failure and stage 1 through stage 4 chronic kidney disease, or unspecified chronic kidney disease: Secondary | ICD-10-CM | POA: Diagnosis not present

## 2018-04-02 DIAGNOSIS — J449 Chronic obstructive pulmonary disease, unspecified: Secondary | ICD-10-CM | POA: Diagnosis not present

## 2018-04-02 DIAGNOSIS — S81801A Unspecified open wound, right lower leg, initial encounter: Secondary | ICD-10-CM | POA: Diagnosis not present

## 2018-04-02 DIAGNOSIS — I5030 Unspecified diastolic (congestive) heart failure: Secondary | ICD-10-CM | POA: Diagnosis not present

## 2018-04-02 DIAGNOSIS — E1122 Type 2 diabetes mellitus with diabetic chronic kidney disease: Secondary | ICD-10-CM | POA: Diagnosis not present

## 2018-04-03 DIAGNOSIS — I130 Hypertensive heart and chronic kidney disease with heart failure and stage 1 through stage 4 chronic kidney disease, or unspecified chronic kidney disease: Secondary | ICD-10-CM | POA: Diagnosis not present

## 2018-04-03 DIAGNOSIS — N182 Chronic kidney disease, stage 2 (mild): Secondary | ICD-10-CM | POA: Diagnosis not present

## 2018-04-03 DIAGNOSIS — E785 Hyperlipidemia, unspecified: Secondary | ICD-10-CM | POA: Diagnosis not present

## 2018-04-03 DIAGNOSIS — E1122 Type 2 diabetes mellitus with diabetic chronic kidney disease: Secondary | ICD-10-CM | POA: Diagnosis not present

## 2018-04-03 DIAGNOSIS — M109 Gout, unspecified: Secondary | ICD-10-CM | POA: Diagnosis not present

## 2018-04-03 DIAGNOSIS — Z9981 Dependence on supplemental oxygen: Secondary | ICD-10-CM | POA: Diagnosis not present

## 2018-04-03 DIAGNOSIS — J449 Chronic obstructive pulmonary disease, unspecified: Secondary | ICD-10-CM | POA: Diagnosis not present

## 2018-04-03 DIAGNOSIS — Z9181 History of falling: Secondary | ICD-10-CM | POA: Diagnosis not present

## 2018-04-03 DIAGNOSIS — I5030 Unspecified diastolic (congestive) heart failure: Secondary | ICD-10-CM | POA: Diagnosis not present

## 2018-04-03 DIAGNOSIS — S81801A Unspecified open wound, right lower leg, initial encounter: Secondary | ICD-10-CM | POA: Diagnosis not present

## 2018-04-06 DIAGNOSIS — I5030 Unspecified diastolic (congestive) heart failure: Secondary | ICD-10-CM | POA: Diagnosis not present

## 2018-04-06 DIAGNOSIS — J449 Chronic obstructive pulmonary disease, unspecified: Secondary | ICD-10-CM | POA: Diagnosis not present

## 2018-04-06 DIAGNOSIS — M109 Gout, unspecified: Secondary | ICD-10-CM | POA: Diagnosis not present

## 2018-04-06 DIAGNOSIS — E785 Hyperlipidemia, unspecified: Secondary | ICD-10-CM | POA: Diagnosis not present

## 2018-04-06 DIAGNOSIS — N182 Chronic kidney disease, stage 2 (mild): Secondary | ICD-10-CM | POA: Diagnosis not present

## 2018-04-06 DIAGNOSIS — I130 Hypertensive heart and chronic kidney disease with heart failure and stage 1 through stage 4 chronic kidney disease, or unspecified chronic kidney disease: Secondary | ICD-10-CM | POA: Diagnosis not present

## 2018-04-06 DIAGNOSIS — Z9981 Dependence on supplemental oxygen: Secondary | ICD-10-CM | POA: Diagnosis not present

## 2018-04-06 DIAGNOSIS — Z9181 History of falling: Secondary | ICD-10-CM | POA: Diagnosis not present

## 2018-04-06 DIAGNOSIS — E1122 Type 2 diabetes mellitus with diabetic chronic kidney disease: Secondary | ICD-10-CM | POA: Diagnosis not present

## 2018-04-06 DIAGNOSIS — R69 Illness, unspecified: Secondary | ICD-10-CM | POA: Diagnosis not present

## 2018-04-06 DIAGNOSIS — S81801A Unspecified open wound, right lower leg, initial encounter: Secondary | ICD-10-CM | POA: Diagnosis not present

## 2018-04-10 DIAGNOSIS — I5030 Unspecified diastolic (congestive) heart failure: Secondary | ICD-10-CM | POA: Diagnosis not present

## 2018-04-10 DIAGNOSIS — S81801A Unspecified open wound, right lower leg, initial encounter: Secondary | ICD-10-CM | POA: Diagnosis not present

## 2018-04-10 DIAGNOSIS — I130 Hypertensive heart and chronic kidney disease with heart failure and stage 1 through stage 4 chronic kidney disease, or unspecified chronic kidney disease: Secondary | ICD-10-CM | POA: Diagnosis not present

## 2018-04-10 DIAGNOSIS — J449 Chronic obstructive pulmonary disease, unspecified: Secondary | ICD-10-CM | POA: Diagnosis not present

## 2018-04-10 DIAGNOSIS — E785 Hyperlipidemia, unspecified: Secondary | ICD-10-CM | POA: Diagnosis not present

## 2018-04-10 DIAGNOSIS — Z9981 Dependence on supplemental oxygen: Secondary | ICD-10-CM | POA: Diagnosis not present

## 2018-04-10 DIAGNOSIS — Z9181 History of falling: Secondary | ICD-10-CM | POA: Diagnosis not present

## 2018-04-10 DIAGNOSIS — N182 Chronic kidney disease, stage 2 (mild): Secondary | ICD-10-CM | POA: Diagnosis not present

## 2018-04-10 DIAGNOSIS — M109 Gout, unspecified: Secondary | ICD-10-CM | POA: Diagnosis not present

## 2018-04-10 DIAGNOSIS — E1122 Type 2 diabetes mellitus with diabetic chronic kidney disease: Secondary | ICD-10-CM | POA: Diagnosis not present

## 2018-04-12 DIAGNOSIS — N182 Chronic kidney disease, stage 2 (mild): Secondary | ICD-10-CM | POA: Diagnosis not present

## 2018-04-12 DIAGNOSIS — Z9181 History of falling: Secondary | ICD-10-CM | POA: Diagnosis not present

## 2018-04-12 DIAGNOSIS — S81801A Unspecified open wound, right lower leg, initial encounter: Secondary | ICD-10-CM | POA: Diagnosis not present

## 2018-04-12 DIAGNOSIS — I130 Hypertensive heart and chronic kidney disease with heart failure and stage 1 through stage 4 chronic kidney disease, or unspecified chronic kidney disease: Secondary | ICD-10-CM | POA: Diagnosis not present

## 2018-04-12 DIAGNOSIS — E785 Hyperlipidemia, unspecified: Secondary | ICD-10-CM | POA: Diagnosis not present

## 2018-04-12 DIAGNOSIS — E1122 Type 2 diabetes mellitus with diabetic chronic kidney disease: Secondary | ICD-10-CM | POA: Diagnosis not present

## 2018-04-12 DIAGNOSIS — M109 Gout, unspecified: Secondary | ICD-10-CM | POA: Diagnosis not present

## 2018-04-12 DIAGNOSIS — Z9981 Dependence on supplemental oxygen: Secondary | ICD-10-CM | POA: Diagnosis not present

## 2018-04-12 DIAGNOSIS — J449 Chronic obstructive pulmonary disease, unspecified: Secondary | ICD-10-CM | POA: Diagnosis not present

## 2018-04-12 DIAGNOSIS — I5030 Unspecified diastolic (congestive) heart failure: Secondary | ICD-10-CM | POA: Diagnosis not present

## 2018-04-16 DIAGNOSIS — I5030 Unspecified diastolic (congestive) heart failure: Secondary | ICD-10-CM | POA: Diagnosis not present

## 2018-04-16 DIAGNOSIS — S81801A Unspecified open wound, right lower leg, initial encounter: Secondary | ICD-10-CM | POA: Diagnosis not present

## 2018-04-16 DIAGNOSIS — J449 Chronic obstructive pulmonary disease, unspecified: Secondary | ICD-10-CM | POA: Diagnosis not present

## 2018-04-16 DIAGNOSIS — I130 Hypertensive heart and chronic kidney disease with heart failure and stage 1 through stage 4 chronic kidney disease, or unspecified chronic kidney disease: Secondary | ICD-10-CM | POA: Diagnosis not present

## 2018-04-16 DIAGNOSIS — E1122 Type 2 diabetes mellitus with diabetic chronic kidney disease: Secondary | ICD-10-CM | POA: Diagnosis not present

## 2018-04-16 DIAGNOSIS — Z9981 Dependence on supplemental oxygen: Secondary | ICD-10-CM | POA: Diagnosis not present

## 2018-04-16 DIAGNOSIS — E785 Hyperlipidemia, unspecified: Secondary | ICD-10-CM | POA: Diagnosis not present

## 2018-04-16 DIAGNOSIS — Z9181 History of falling: Secondary | ICD-10-CM | POA: Diagnosis not present

## 2018-04-16 DIAGNOSIS — N182 Chronic kidney disease, stage 2 (mild): Secondary | ICD-10-CM | POA: Diagnosis not present

## 2018-04-16 DIAGNOSIS — M109 Gout, unspecified: Secondary | ICD-10-CM | POA: Diagnosis not present

## 2018-04-20 DIAGNOSIS — J449 Chronic obstructive pulmonary disease, unspecified: Secondary | ICD-10-CM | POA: Diagnosis not present

## 2018-04-23 DIAGNOSIS — N183 Chronic kidney disease, stage 3 (moderate): Secondary | ICD-10-CM | POA: Diagnosis not present

## 2018-05-01 DIAGNOSIS — E1122 Type 2 diabetes mellitus with diabetic chronic kidney disease: Secondary | ICD-10-CM | POA: Diagnosis not present

## 2018-05-01 DIAGNOSIS — J449 Chronic obstructive pulmonary disease, unspecified: Secondary | ICD-10-CM | POA: Diagnosis not present

## 2018-05-01 DIAGNOSIS — E785 Hyperlipidemia, unspecified: Secondary | ICD-10-CM | POA: Diagnosis not present

## 2018-05-01 DIAGNOSIS — Z6831 Body mass index (BMI) 31.0-31.9, adult: Secondary | ICD-10-CM | POA: Diagnosis not present

## 2018-05-01 DIAGNOSIS — I1310 Hypertensive heart and chronic kidney disease without heart failure, with stage 1 through stage 4 chronic kidney disease, or unspecified chronic kidney disease: Secondary | ICD-10-CM | POA: Diagnosis not present

## 2018-05-01 DIAGNOSIS — S81801A Unspecified open wound, right lower leg, initial encounter: Secondary | ICD-10-CM | POA: Diagnosis not present

## 2018-05-02 DIAGNOSIS — H401111 Primary open-angle glaucoma, right eye, mild stage: Secondary | ICD-10-CM | POA: Diagnosis not present

## 2018-05-02 DIAGNOSIS — E113292 Type 2 diabetes mellitus with mild nonproliferative diabetic retinopathy without macular edema, left eye: Secondary | ICD-10-CM | POA: Diagnosis not present

## 2018-05-02 DIAGNOSIS — H35351 Cystoid macular degeneration, right eye: Secondary | ICD-10-CM | POA: Diagnosis not present

## 2018-05-02 DIAGNOSIS — H348312 Tributary (branch) retinal vein occlusion, right eye, stable: Secondary | ICD-10-CM | POA: Diagnosis not present

## 2018-05-20 DIAGNOSIS — J449 Chronic obstructive pulmonary disease, unspecified: Secondary | ICD-10-CM | POA: Diagnosis not present

## 2018-05-24 DIAGNOSIS — E559 Vitamin D deficiency, unspecified: Secondary | ICD-10-CM | POA: Insufficient documentation

## 2018-05-24 DIAGNOSIS — I129 Hypertensive chronic kidney disease with stage 1 through stage 4 chronic kidney disease, or unspecified chronic kidney disease: Secondary | ICD-10-CM | POA: Diagnosis not present

## 2018-05-24 DIAGNOSIS — N183 Chronic kidney disease, stage 3 (moderate): Secondary | ICD-10-CM | POA: Diagnosis not present

## 2018-05-24 DIAGNOSIS — E889 Metabolic disorder, unspecified: Secondary | ICD-10-CM | POA: Diagnosis not present

## 2018-05-24 DIAGNOSIS — E1122 Type 2 diabetes mellitus with diabetic chronic kidney disease: Secondary | ICD-10-CM | POA: Diagnosis not present

## 2018-05-24 DIAGNOSIS — M9080 Osteopathy in diseases classified elsewhere, unspecified site: Secondary | ICD-10-CM | POA: Diagnosis not present

## 2018-05-24 DIAGNOSIS — M898X9 Other specified disorders of bone, unspecified site: Secondary | ICD-10-CM | POA: Insufficient documentation

## 2018-06-12 DIAGNOSIS — M25511 Pain in right shoulder: Secondary | ICD-10-CM | POA: Diagnosis not present

## 2018-06-20 DIAGNOSIS — J449 Chronic obstructive pulmonary disease, unspecified: Secondary | ICD-10-CM | POA: Diagnosis not present

## 2018-06-28 DIAGNOSIS — L03115 Cellulitis of right lower limb: Secondary | ICD-10-CM | POA: Diagnosis not present

## 2018-06-28 DIAGNOSIS — E1151 Type 2 diabetes mellitus with diabetic peripheral angiopathy without gangrene: Secondary | ICD-10-CM | POA: Diagnosis not present

## 2018-06-28 DIAGNOSIS — R609 Edema, unspecified: Secondary | ICD-10-CM | POA: Diagnosis not present

## 2018-06-28 DIAGNOSIS — H409 Unspecified glaucoma: Secondary | ICD-10-CM | POA: Diagnosis not present

## 2018-06-28 DIAGNOSIS — K5900 Constipation, unspecified: Secondary | ICD-10-CM | POA: Diagnosis not present

## 2018-06-28 DIAGNOSIS — M1990 Unspecified osteoarthritis, unspecified site: Secondary | ICD-10-CM | POA: Diagnosis not present

## 2018-06-28 DIAGNOSIS — Z008 Encounter for other general examination: Secondary | ICD-10-CM | POA: Diagnosis not present

## 2018-06-28 DIAGNOSIS — E669 Obesity, unspecified: Secondary | ICD-10-CM | POA: Diagnosis not present

## 2018-06-28 DIAGNOSIS — J449 Chronic obstructive pulmonary disease, unspecified: Secondary | ICD-10-CM | POA: Diagnosis not present

## 2018-06-28 DIAGNOSIS — I10 Essential (primary) hypertension: Secondary | ICD-10-CM | POA: Diagnosis not present

## 2018-06-28 DIAGNOSIS — Z794 Long term (current) use of insulin: Secondary | ICD-10-CM | POA: Diagnosis not present

## 2018-06-28 DIAGNOSIS — G8929 Other chronic pain: Secondary | ICD-10-CM | POA: Diagnosis not present

## 2018-07-21 DIAGNOSIS — J449 Chronic obstructive pulmonary disease, unspecified: Secondary | ICD-10-CM | POA: Diagnosis not present

## 2018-07-23 DIAGNOSIS — M25511 Pain in right shoulder: Secondary | ICD-10-CM | POA: Diagnosis not present

## 2018-07-23 DIAGNOSIS — S43101S Unspecified dislocation of right acromioclavicular joint, sequela: Secondary | ICD-10-CM | POA: Diagnosis not present

## 2018-07-23 DIAGNOSIS — M7531 Calcific tendinitis of right shoulder: Secondary | ICD-10-CM | POA: Diagnosis not present

## 2018-07-26 DIAGNOSIS — R69 Illness, unspecified: Secondary | ICD-10-CM | POA: Diagnosis not present

## 2018-07-26 DIAGNOSIS — E785 Hyperlipidemia, unspecified: Secondary | ICD-10-CM | POA: Diagnosis not present

## 2018-07-26 DIAGNOSIS — Z9181 History of falling: Secondary | ICD-10-CM | POA: Diagnosis not present

## 2018-07-26 DIAGNOSIS — Z9981 Dependence on supplemental oxygen: Secondary | ICD-10-CM | POA: Diagnosis not present

## 2018-07-26 DIAGNOSIS — I130 Hypertensive heart and chronic kidney disease with heart failure and stage 1 through stage 4 chronic kidney disease, or unspecified chronic kidney disease: Secondary | ICD-10-CM | POA: Diagnosis not present

## 2018-07-26 DIAGNOSIS — S43101S Unspecified dislocation of right acromioclavicular joint, sequela: Secondary | ICD-10-CM | POA: Diagnosis not present

## 2018-07-26 DIAGNOSIS — I5030 Unspecified diastolic (congestive) heart failure: Secondary | ICD-10-CM | POA: Diagnosis not present

## 2018-07-26 DIAGNOSIS — N183 Chronic kidney disease, stage 3 (moderate): Secondary | ICD-10-CM | POA: Diagnosis not present

## 2018-07-26 DIAGNOSIS — E1122 Type 2 diabetes mellitus with diabetic chronic kidney disease: Secondary | ICD-10-CM | POA: Diagnosis not present

## 2018-07-26 DIAGNOSIS — J449 Chronic obstructive pulmonary disease, unspecified: Secondary | ICD-10-CM | POA: Diagnosis not present

## 2018-07-26 DIAGNOSIS — M109 Gout, unspecified: Secondary | ICD-10-CM | POA: Diagnosis not present

## 2018-07-31 DIAGNOSIS — E785 Hyperlipidemia, unspecified: Secondary | ICD-10-CM | POA: Diagnosis not present

## 2018-07-31 DIAGNOSIS — N183 Chronic kidney disease, stage 3 (moderate): Secondary | ICD-10-CM | POA: Diagnosis not present

## 2018-07-31 DIAGNOSIS — Z9181 History of falling: Secondary | ICD-10-CM | POA: Diagnosis not present

## 2018-07-31 DIAGNOSIS — S43101S Unspecified dislocation of right acromioclavicular joint, sequela: Secondary | ICD-10-CM | POA: Diagnosis not present

## 2018-07-31 DIAGNOSIS — E1122 Type 2 diabetes mellitus with diabetic chronic kidney disease: Secondary | ICD-10-CM | POA: Diagnosis not present

## 2018-07-31 DIAGNOSIS — Z9981 Dependence on supplemental oxygen: Secondary | ICD-10-CM | POA: Diagnosis not present

## 2018-07-31 DIAGNOSIS — M109 Gout, unspecified: Secondary | ICD-10-CM | POA: Diagnosis not present

## 2018-07-31 DIAGNOSIS — J449 Chronic obstructive pulmonary disease, unspecified: Secondary | ICD-10-CM | POA: Diagnosis not present

## 2018-07-31 DIAGNOSIS — I5030 Unspecified diastolic (congestive) heart failure: Secondary | ICD-10-CM | POA: Diagnosis not present

## 2018-07-31 DIAGNOSIS — I130 Hypertensive heart and chronic kidney disease with heart failure and stage 1 through stage 4 chronic kidney disease, or unspecified chronic kidney disease: Secondary | ICD-10-CM | POA: Diagnosis not present

## 2018-08-07 DIAGNOSIS — M109 Gout, unspecified: Secondary | ICD-10-CM | POA: Diagnosis not present

## 2018-08-07 DIAGNOSIS — Z9181 History of falling: Secondary | ICD-10-CM | POA: Diagnosis not present

## 2018-08-07 DIAGNOSIS — J449 Chronic obstructive pulmonary disease, unspecified: Secondary | ICD-10-CM | POA: Diagnosis not present

## 2018-08-07 DIAGNOSIS — E785 Hyperlipidemia, unspecified: Secondary | ICD-10-CM | POA: Diagnosis not present

## 2018-08-07 DIAGNOSIS — E1122 Type 2 diabetes mellitus with diabetic chronic kidney disease: Secondary | ICD-10-CM | POA: Diagnosis not present

## 2018-08-07 DIAGNOSIS — S43101S Unspecified dislocation of right acromioclavicular joint, sequela: Secondary | ICD-10-CM | POA: Diagnosis not present

## 2018-08-07 DIAGNOSIS — I5030 Unspecified diastolic (congestive) heart failure: Secondary | ICD-10-CM | POA: Diagnosis not present

## 2018-08-07 DIAGNOSIS — Z9981 Dependence on supplemental oxygen: Secondary | ICD-10-CM | POA: Diagnosis not present

## 2018-08-07 DIAGNOSIS — N183 Chronic kidney disease, stage 3 (moderate): Secondary | ICD-10-CM | POA: Diagnosis not present

## 2018-08-07 DIAGNOSIS — I130 Hypertensive heart and chronic kidney disease with heart failure and stage 1 through stage 4 chronic kidney disease, or unspecified chronic kidney disease: Secondary | ICD-10-CM | POA: Diagnosis not present

## 2018-08-08 DIAGNOSIS — I5030 Unspecified diastolic (congestive) heart failure: Secondary | ICD-10-CM | POA: Diagnosis not present

## 2018-08-08 DIAGNOSIS — E1122 Type 2 diabetes mellitus with diabetic chronic kidney disease: Secondary | ICD-10-CM | POA: Diagnosis not present

## 2018-08-08 DIAGNOSIS — N183 Chronic kidney disease, stage 3 (moderate): Secondary | ICD-10-CM | POA: Diagnosis not present

## 2018-08-08 DIAGNOSIS — I130 Hypertensive heart and chronic kidney disease with heart failure and stage 1 through stage 4 chronic kidney disease, or unspecified chronic kidney disease: Secondary | ICD-10-CM | POA: Diagnosis not present

## 2018-08-08 DIAGNOSIS — J449 Chronic obstructive pulmonary disease, unspecified: Secondary | ICD-10-CM | POA: Diagnosis not present

## 2018-08-08 DIAGNOSIS — S43101S Unspecified dislocation of right acromioclavicular joint, sequela: Secondary | ICD-10-CM | POA: Diagnosis not present

## 2018-08-08 DIAGNOSIS — Z9181 History of falling: Secondary | ICD-10-CM | POA: Diagnosis not present

## 2018-08-08 DIAGNOSIS — M109 Gout, unspecified: Secondary | ICD-10-CM | POA: Diagnosis not present

## 2018-08-08 DIAGNOSIS — E785 Hyperlipidemia, unspecified: Secondary | ICD-10-CM | POA: Diagnosis not present

## 2018-08-08 DIAGNOSIS — Z9981 Dependence on supplemental oxygen: Secondary | ICD-10-CM | POA: Diagnosis not present

## 2018-08-09 DIAGNOSIS — J449 Chronic obstructive pulmonary disease, unspecified: Secondary | ICD-10-CM | POA: Diagnosis not present

## 2018-08-19 DIAGNOSIS — J449 Chronic obstructive pulmonary disease, unspecified: Secondary | ICD-10-CM | POA: Diagnosis not present

## 2018-09-05 DIAGNOSIS — J449 Chronic obstructive pulmonary disease, unspecified: Secondary | ICD-10-CM | POA: Diagnosis not present

## 2018-09-05 DIAGNOSIS — W19XXXA Unspecified fall, initial encounter: Secondary | ICD-10-CM | POA: Diagnosis not present

## 2018-09-05 DIAGNOSIS — Z79899 Other long term (current) drug therapy: Secondary | ICD-10-CM | POA: Diagnosis not present

## 2018-09-05 DIAGNOSIS — E785 Hyperlipidemia, unspecified: Secondary | ICD-10-CM | POA: Diagnosis not present

## 2018-09-05 DIAGNOSIS — I1310 Hypertensive heart and chronic kidney disease without heart failure, with stage 1 through stage 4 chronic kidney disease, or unspecified chronic kidney disease: Secondary | ICD-10-CM | POA: Diagnosis not present

## 2018-09-05 DIAGNOSIS — J9611 Chronic respiratory failure with hypoxia: Secondary | ICD-10-CM | POA: Diagnosis not present

## 2018-09-05 DIAGNOSIS — E1122 Type 2 diabetes mellitus with diabetic chronic kidney disease: Secondary | ICD-10-CM | POA: Diagnosis not present

## 2018-09-05 DIAGNOSIS — M109 Gout, unspecified: Secondary | ICD-10-CM | POA: Diagnosis not present

## 2018-09-05 DIAGNOSIS — I5030 Unspecified diastolic (congestive) heart failure: Secondary | ICD-10-CM | POA: Diagnosis not present

## 2018-09-05 DIAGNOSIS — I714 Abdominal aortic aneurysm, without rupture: Secondary | ICD-10-CM | POA: Diagnosis not present

## 2018-09-07 DIAGNOSIS — E785 Hyperlipidemia, unspecified: Secondary | ICD-10-CM | POA: Diagnosis not present

## 2018-09-07 DIAGNOSIS — M109 Gout, unspecified: Secondary | ICD-10-CM | POA: Diagnosis not present

## 2018-09-10 DIAGNOSIS — I5030 Unspecified diastolic (congestive) heart failure: Secondary | ICD-10-CM | POA: Diagnosis not present

## 2018-09-10 DIAGNOSIS — E1122 Type 2 diabetes mellitus with diabetic chronic kidney disease: Secondary | ICD-10-CM | POA: Diagnosis not present

## 2018-09-13 DIAGNOSIS — N183 Chronic kidney disease, stage 3 (moderate): Secondary | ICD-10-CM | POA: Diagnosis not present

## 2018-09-13 DIAGNOSIS — I130 Hypertensive heart and chronic kidney disease with heart failure and stage 1 through stage 4 chronic kidney disease, or unspecified chronic kidney disease: Secondary | ICD-10-CM | POA: Diagnosis not present

## 2018-09-13 DIAGNOSIS — J449 Chronic obstructive pulmonary disease, unspecified: Secondary | ICD-10-CM | POA: Diagnosis not present

## 2018-09-13 DIAGNOSIS — S43101S Unspecified dislocation of right acromioclavicular joint, sequela: Secondary | ICD-10-CM | POA: Diagnosis not present

## 2018-09-13 DIAGNOSIS — Z9181 History of falling: Secondary | ICD-10-CM | POA: Diagnosis not present

## 2018-09-13 DIAGNOSIS — Z9981 Dependence on supplemental oxygen: Secondary | ICD-10-CM | POA: Diagnosis not present

## 2018-09-13 DIAGNOSIS — E1122 Type 2 diabetes mellitus with diabetic chronic kidney disease: Secondary | ICD-10-CM | POA: Diagnosis not present

## 2018-09-13 DIAGNOSIS — I5030 Unspecified diastolic (congestive) heart failure: Secondary | ICD-10-CM | POA: Diagnosis not present

## 2018-09-13 DIAGNOSIS — M109 Gout, unspecified: Secondary | ICD-10-CM | POA: Diagnosis not present

## 2018-09-13 DIAGNOSIS — E785 Hyperlipidemia, unspecified: Secondary | ICD-10-CM | POA: Diagnosis not present

## 2018-09-17 DIAGNOSIS — S43101S Unspecified dislocation of right acromioclavicular joint, sequela: Secondary | ICD-10-CM | POA: Diagnosis not present

## 2018-09-17 DIAGNOSIS — I5030 Unspecified diastolic (congestive) heart failure: Secondary | ICD-10-CM | POA: Diagnosis not present

## 2018-09-17 DIAGNOSIS — J449 Chronic obstructive pulmonary disease, unspecified: Secondary | ICD-10-CM | POA: Diagnosis not present

## 2018-09-17 DIAGNOSIS — E1122 Type 2 diabetes mellitus with diabetic chronic kidney disease: Secondary | ICD-10-CM | POA: Diagnosis not present

## 2018-09-17 DIAGNOSIS — N183 Chronic kidney disease, stage 3 (moderate): Secondary | ICD-10-CM | POA: Diagnosis not present

## 2018-09-17 DIAGNOSIS — Z9181 History of falling: Secondary | ICD-10-CM | POA: Diagnosis not present

## 2018-09-17 DIAGNOSIS — M109 Gout, unspecified: Secondary | ICD-10-CM | POA: Diagnosis not present

## 2018-09-17 DIAGNOSIS — I130 Hypertensive heart and chronic kidney disease with heart failure and stage 1 through stage 4 chronic kidney disease, or unspecified chronic kidney disease: Secondary | ICD-10-CM | POA: Diagnosis not present

## 2018-09-17 DIAGNOSIS — E785 Hyperlipidemia, unspecified: Secondary | ICD-10-CM | POA: Diagnosis not present

## 2018-09-17 DIAGNOSIS — Z9981 Dependence on supplemental oxygen: Secondary | ICD-10-CM | POA: Diagnosis not present

## 2018-09-19 DIAGNOSIS — J449 Chronic obstructive pulmonary disease, unspecified: Secondary | ICD-10-CM | POA: Diagnosis not present

## 2018-09-20 DIAGNOSIS — N183 Chronic kidney disease, stage 3 (moderate): Secondary | ICD-10-CM | POA: Diagnosis not present

## 2018-09-20 DIAGNOSIS — S43101S Unspecified dislocation of right acromioclavicular joint, sequela: Secondary | ICD-10-CM | POA: Diagnosis not present

## 2018-09-20 DIAGNOSIS — M109 Gout, unspecified: Secondary | ICD-10-CM | POA: Diagnosis not present

## 2018-09-20 DIAGNOSIS — I130 Hypertensive heart and chronic kidney disease with heart failure and stage 1 through stage 4 chronic kidney disease, or unspecified chronic kidney disease: Secondary | ICD-10-CM | POA: Diagnosis not present

## 2018-09-20 DIAGNOSIS — I5030 Unspecified diastolic (congestive) heart failure: Secondary | ICD-10-CM | POA: Diagnosis not present

## 2018-09-20 DIAGNOSIS — J449 Chronic obstructive pulmonary disease, unspecified: Secondary | ICD-10-CM | POA: Diagnosis not present

## 2018-09-20 DIAGNOSIS — Z9181 History of falling: Secondary | ICD-10-CM | POA: Diagnosis not present

## 2018-09-20 DIAGNOSIS — Z9981 Dependence on supplemental oxygen: Secondary | ICD-10-CM | POA: Diagnosis not present

## 2018-09-20 DIAGNOSIS — E1122 Type 2 diabetes mellitus with diabetic chronic kidney disease: Secondary | ICD-10-CM | POA: Diagnosis not present

## 2018-09-20 DIAGNOSIS — E785 Hyperlipidemia, unspecified: Secondary | ICD-10-CM | POA: Diagnosis not present

## 2018-09-27 DIAGNOSIS — Z136 Encounter for screening for cardiovascular disorders: Secondary | ICD-10-CM | POA: Diagnosis not present

## 2018-09-27 DIAGNOSIS — E785 Hyperlipidemia, unspecified: Secondary | ICD-10-CM | POA: Diagnosis not present

## 2018-09-27 DIAGNOSIS — Z6830 Body mass index (BMI) 30.0-30.9, adult: Secondary | ICD-10-CM | POA: Diagnosis not present

## 2018-09-27 DIAGNOSIS — Z9181 History of falling: Secondary | ICD-10-CM | POA: Diagnosis not present

## 2018-09-27 DIAGNOSIS — Z1331 Encounter for screening for depression: Secondary | ICD-10-CM | POA: Diagnosis not present

## 2018-09-27 DIAGNOSIS — Z0000 Encounter for general adult medical examination without abnormal findings: Secondary | ICD-10-CM | POA: Diagnosis not present

## 2018-09-27 DIAGNOSIS — E669 Obesity, unspecified: Secondary | ICD-10-CM | POA: Diagnosis not present

## 2018-10-12 DIAGNOSIS — R69 Illness, unspecified: Secondary | ICD-10-CM | POA: Diagnosis not present

## 2018-10-19 DIAGNOSIS — J449 Chronic obstructive pulmonary disease, unspecified: Secondary | ICD-10-CM | POA: Diagnosis not present

## 2018-11-04 IMAGING — CT CT HEAD W/O CM
4 series · 16 of 47 positions shown, 18 images · non-contrast
Comparison: None.

CLINICAL DATA: Pain following fall

EXAM:
CT HEAD WITHOUT CONTRAST
TECHNIQUE: Contiguous axial images were obtained from the base of the skull
through the vertex without intravenous contrast.

[Series 3: head wo · axial · 0.43mm/px · z∈[-180,-60]mm · 7 of 34 slices shown, 9 images]
[im 5/34  brain]
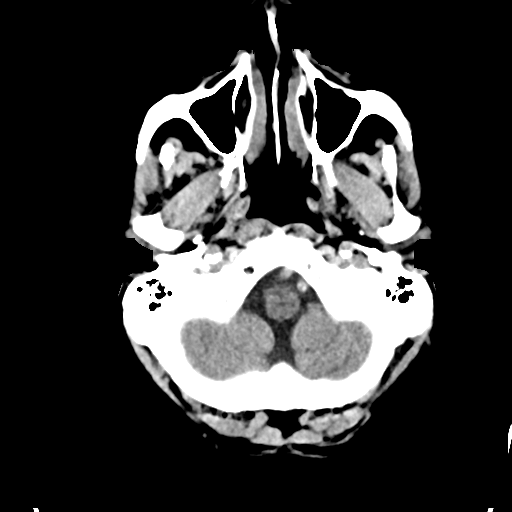
[im 5/34  bone]
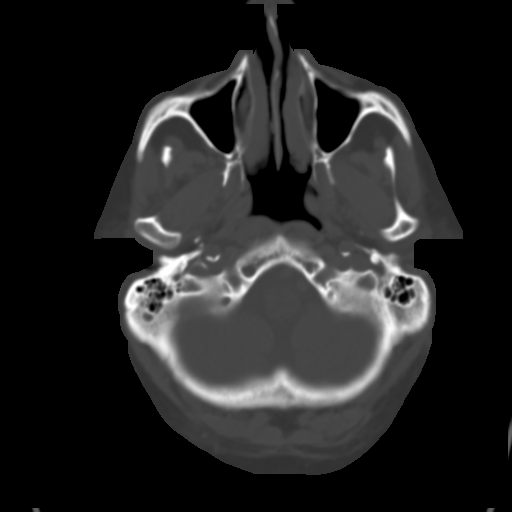
[im 9/34  brain]
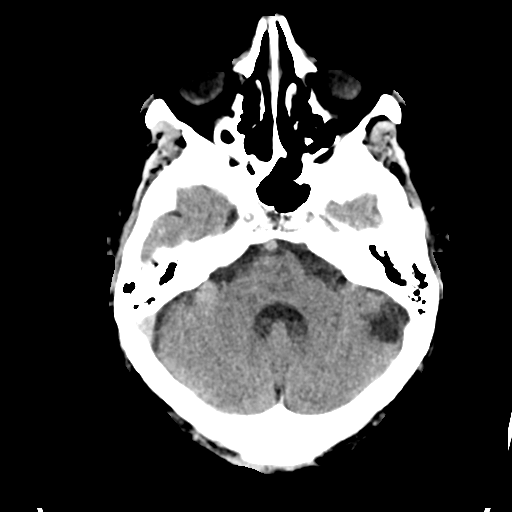
[im 13/34  brain]
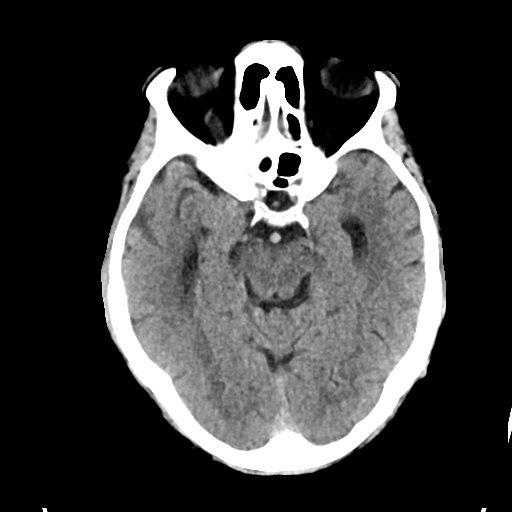
[im 17/34  brain]
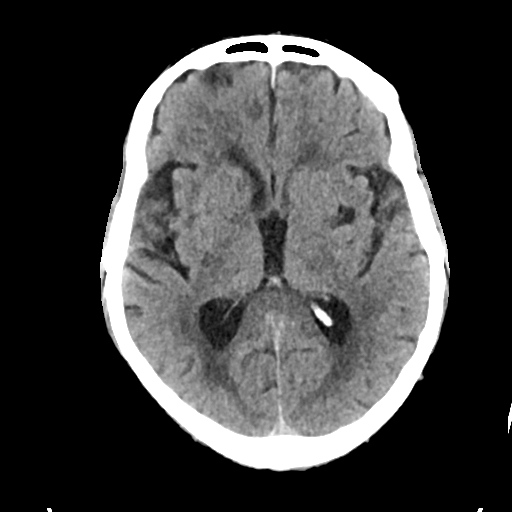
[im 21/34  brain]
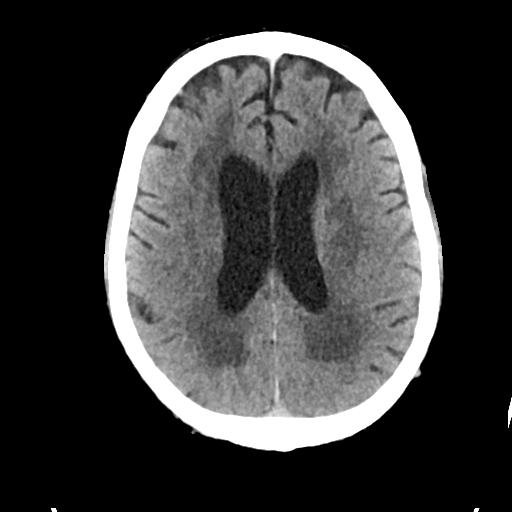
[im 21/34  bone]
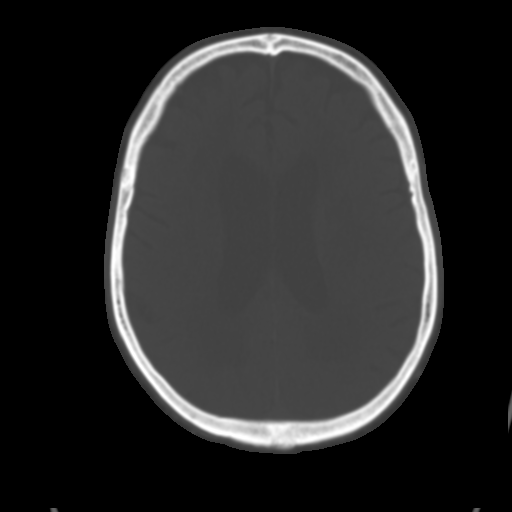
[im 25/34  brain]
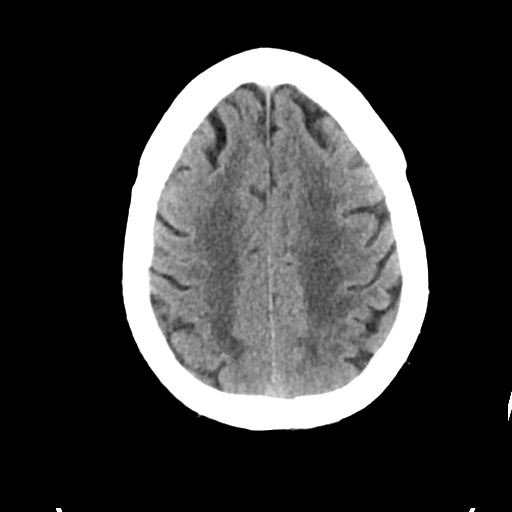
[im 29/34  brain]
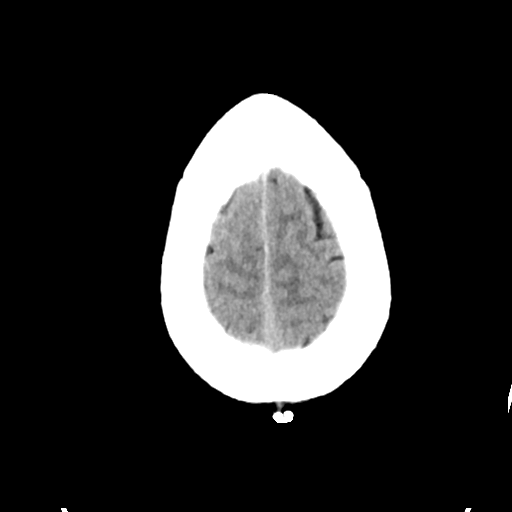

[Series 4: head bone · axial · 0.43mm/px · z∈[-184,-152]mm · 3 of 84 slices shown]
[im 9/84  bone]
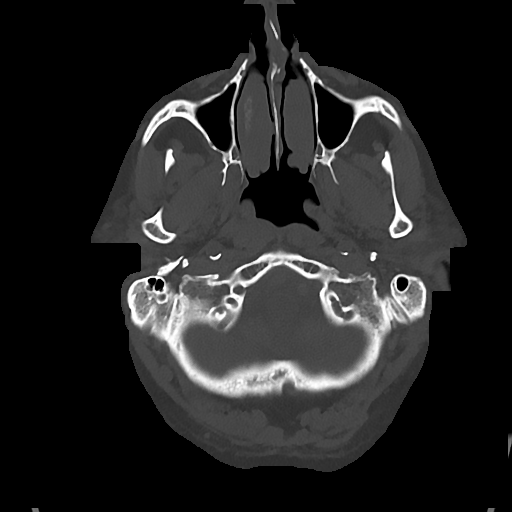
[im 17/84  bone]
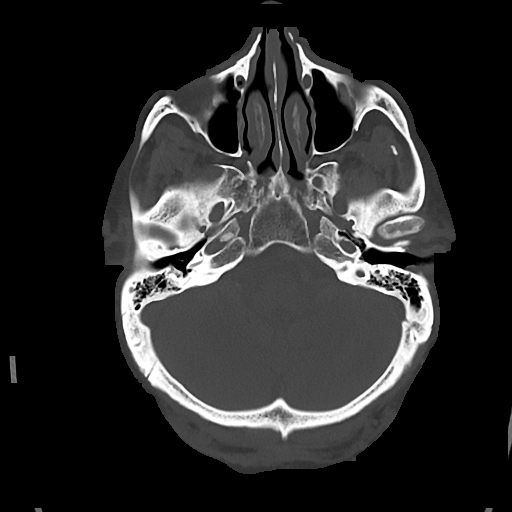
[im 25/84  bone]
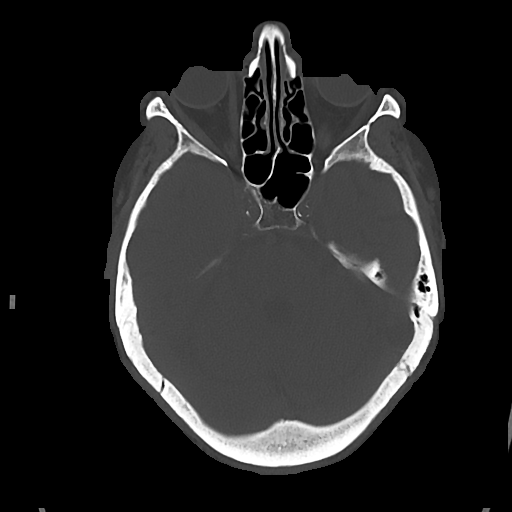

[Series 5: cor soft · coronal · 0.34mm/px · 3 of 74 slices shown]
[im 25/74  brain]
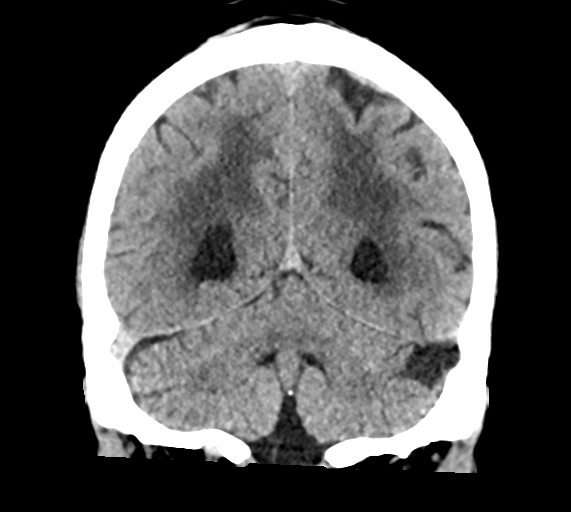
[im 33/74  brain]
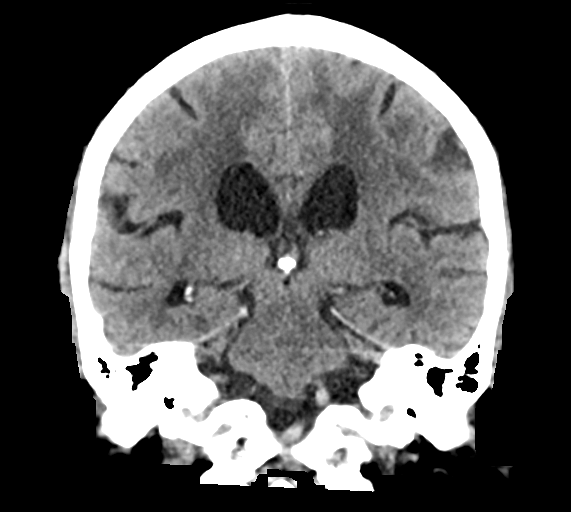
[im 41/74  brain]
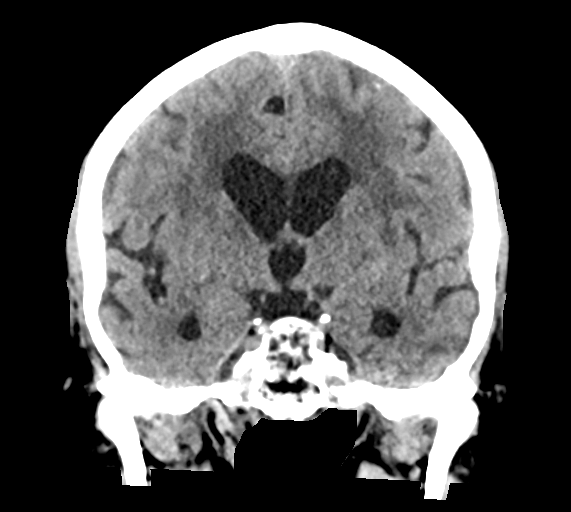

[Series 6: sag soft · sagittal · 0.34mm/px · 3 of 65 slices shown]
[im 22/65  brain]
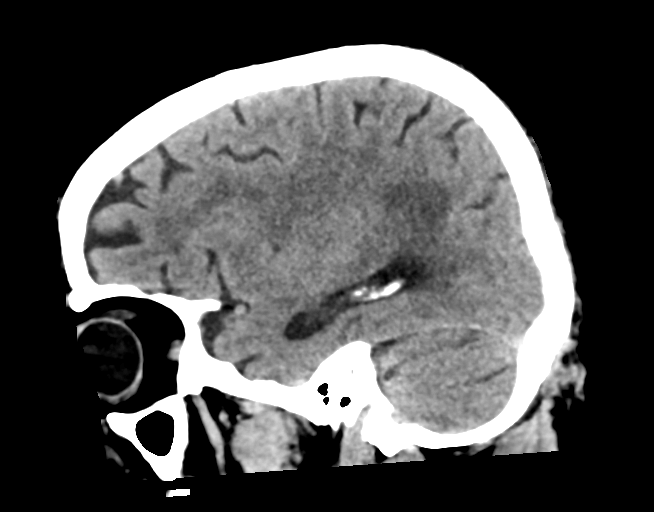
[im 33/65  brain]
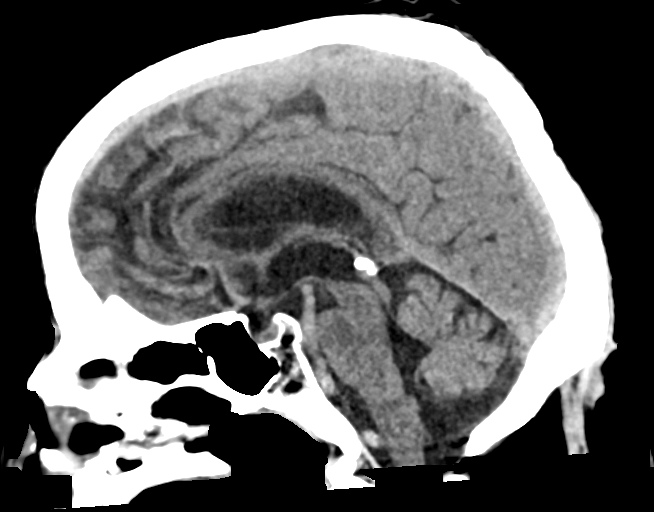
[im 43/65  brain]
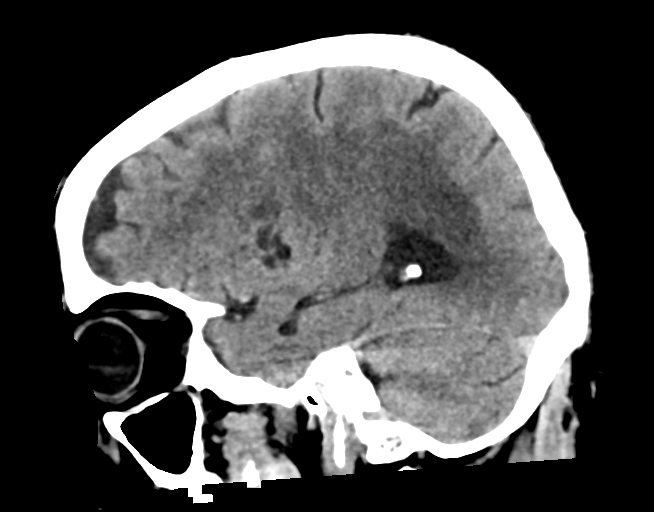

[16 of 47 positions shown; findings below may reference images not displayed]

FINDINGS: Brain: There is mild diffuse atrophy. There is no intracranial mass,
hemorrhage, extra-axial fluid collection, or midline shift. There is
evidence of a prior infarct along the periphery of the left
cerebellum laterally. There is evidence of a prior infarct in the
posterior left lentiform nucleus. There is patchy small vessel
disease throughout the centra semiovale bilaterally. There are
patchy areas of small vessel disease in both basal ganglia regions
as well as in each thalamus. There is also small vessel disease in
the mid to lower pons in the basilar perforator distribution. No
evident acute infarct.

Vascular: There is no appreciable hyperdense vessel. There is
calcification in each carotid siphon and distal vertebral artery.

Skull: Bony calvarium appears intact. Skin staples are noted
posteriorly in the midline region without appreciable associated
hematoma.

Sinuses/Orbits: There is mucosal thickening in several ethmoid air
cells bilaterally. There is patchy opacity in the right sphenoid
sinus. Other paranasal sinuses are clear. Orbits appear symmetric
bilaterally.

Other: Mastoid air cells are clear.
IMPRESSION: Atrophy with extensive small vessel disease as well as areas of
prior infarct as noted. No acute infarct evident. No mass or
hemorrhage.

Foci of arterial vascular calcification noted. Areas of paranasal
sinus disease noted, most notably in the right sphenoid sinus
region.

## 2018-11-19 DIAGNOSIS — J449 Chronic obstructive pulmonary disease, unspecified: Secondary | ICD-10-CM | POA: Diagnosis not present

## 2018-12-09 IMAGING — CT CT CTA ABD/PEL W/CM AND/OR W/O CM
2 of 8 series · 14 of 46 positions shown, 16 images · IV contrast (APPLIED)
Comparison: Renal ultrasound 10/05/2017

CLINICAL DATA: Abdominal aortic aneurysm.  Pre-operative planning.

EXAM:
CT ANGIOGRAPHY ABDOMEN AND PELVIS WITH CONTRAST CONTRAST
TECHNIQUE: Multidetector CT imaging of the abdomen and pelvis was performed
using the standard protocol during bolus administration of
intravenous contrast. Multiplanar reconstructed images and MIPs were
obtained and reviewed to evaluate the vascular anatomy.
CONTRAST:  75mL [id number] IOPAMIDOL (U269UV-SVU) INJECTION 76%

[Series 5: dissection 3.0 i30f 3 · axial · 0.98mm/px · z∈[+960,+1404]mm · 11 of 164 slices shown, 13 images]
[im 8/164  soft-tissue]
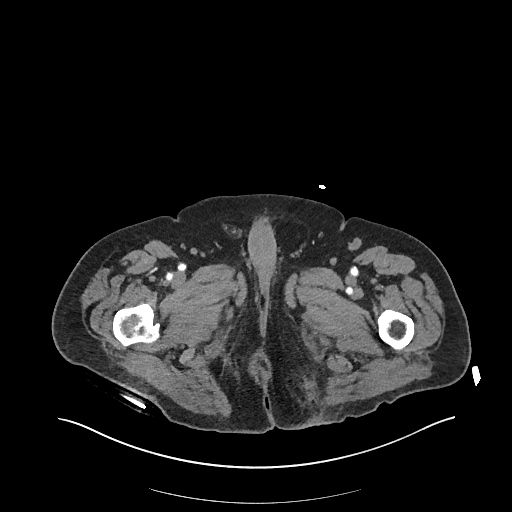
[im 8/164  bone]
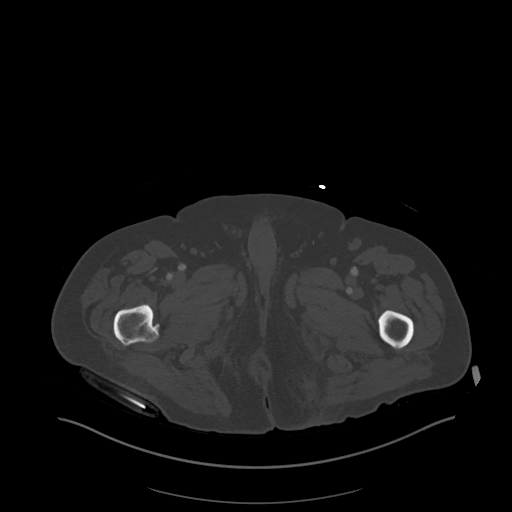
[im 24/164  soft-tissue]
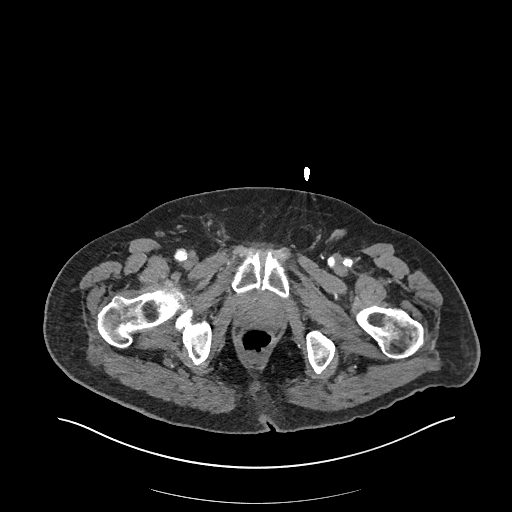
[im 39/164  soft-tissue]
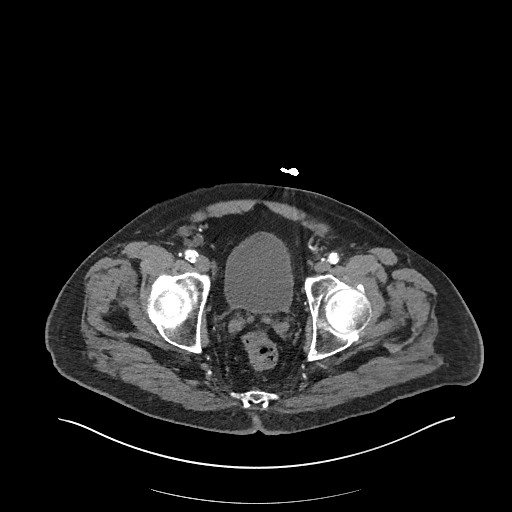
[im 55/164  soft-tissue]
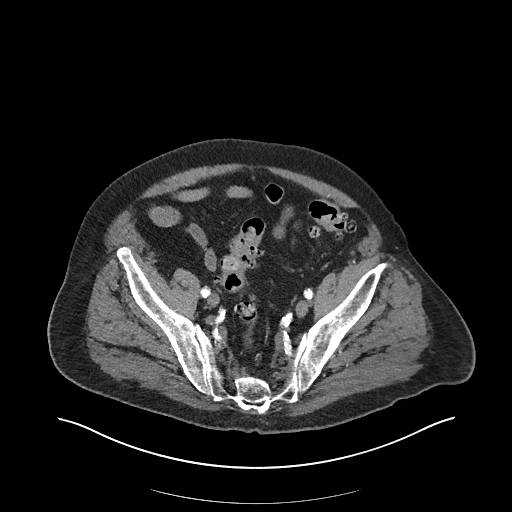
[im 70/164  soft-tissue]
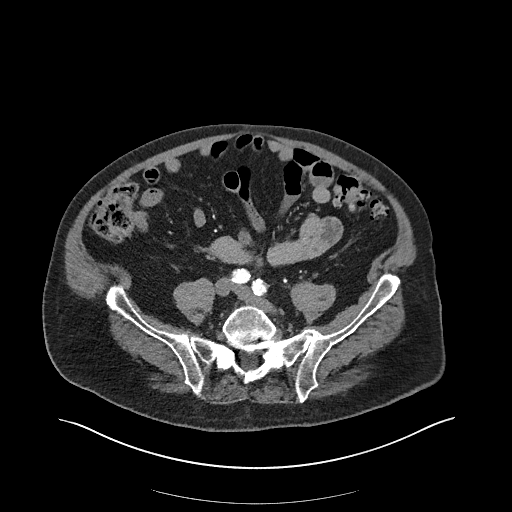
[im 86/164  soft-tissue]
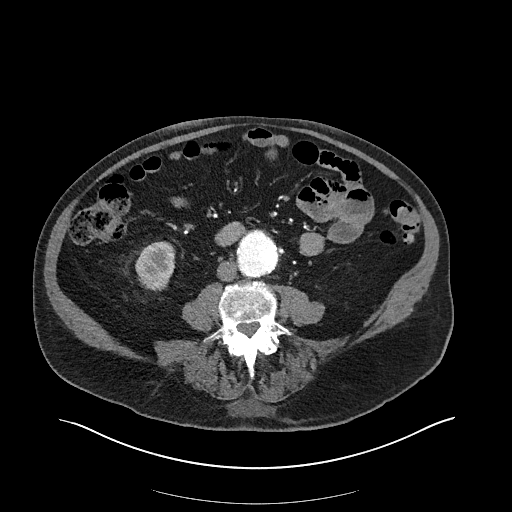
[im 94/164  soft-tissue]
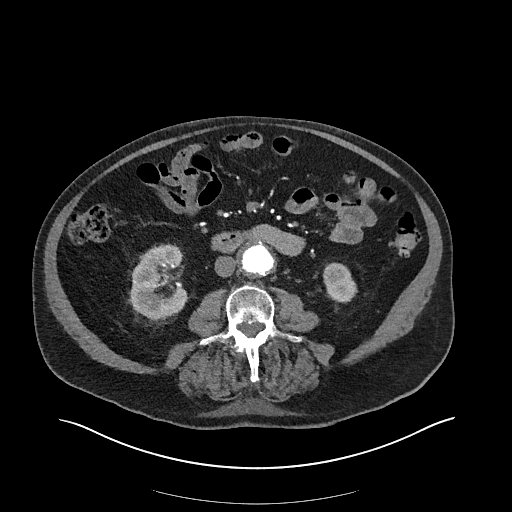
[im 109/164  soft-tissue]
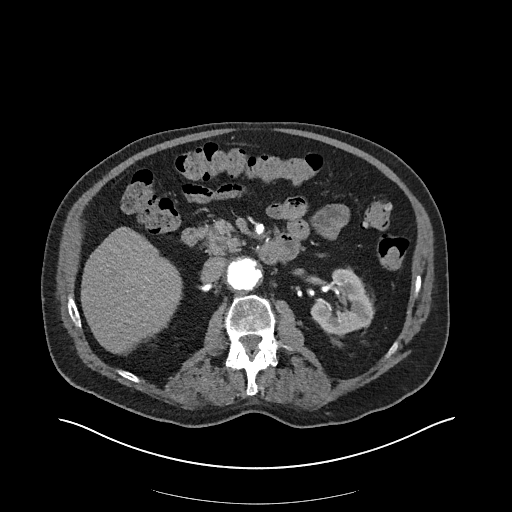
[im 125/164  soft-tissue]
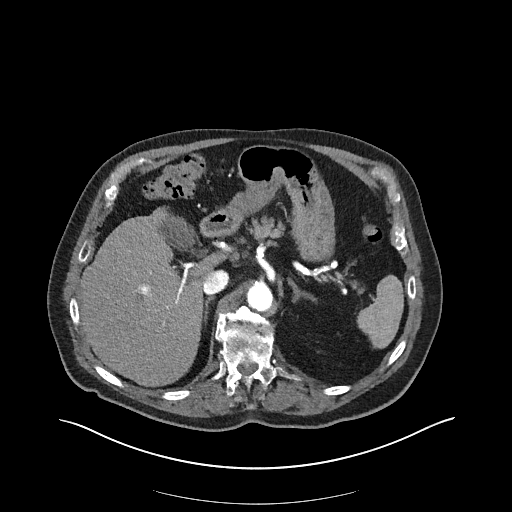
[im 125/164  bone]
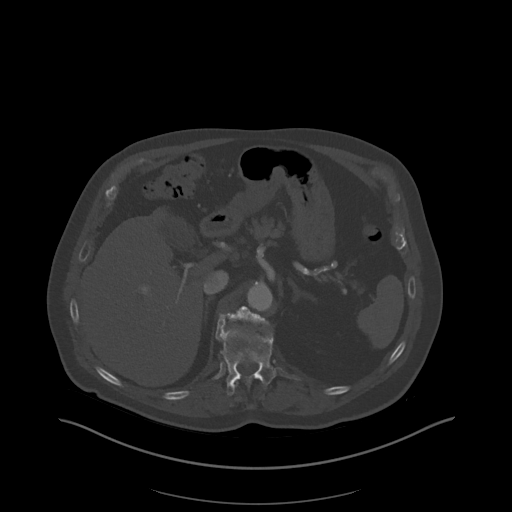
[im 140/164  soft-tissue]
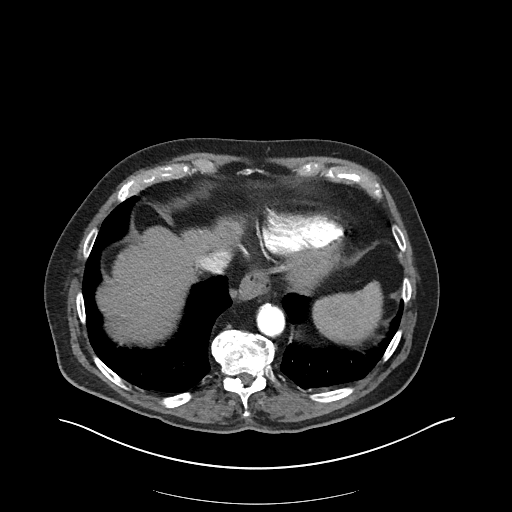
[im 156/164  soft-tissue]
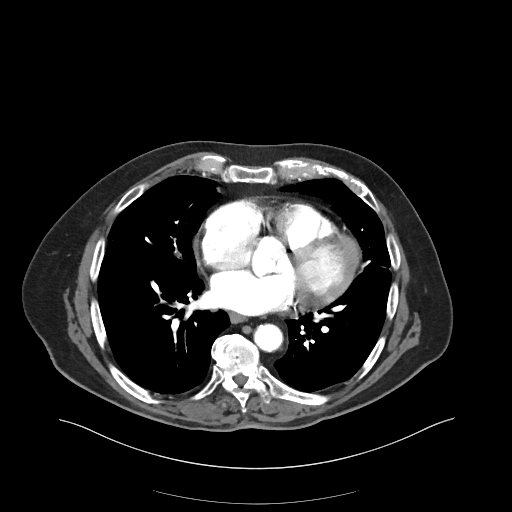

[Series 8: coronals · coronal · 0.86mm/px · 3 of 167 slices shown]
[im 42/167  soft-tissue]
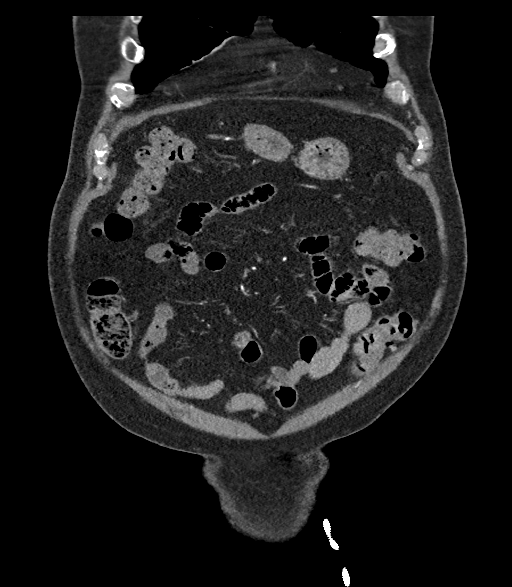
[im 84/167  soft-tissue]
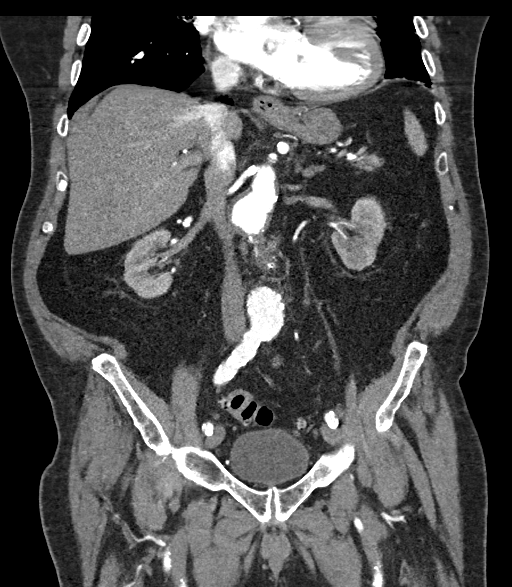
[im 125/167  soft-tissue]
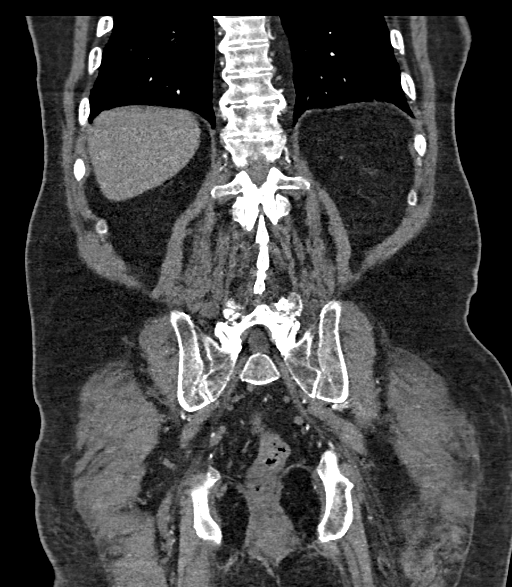

[14 of 46 positions shown; findings below may reference images not displayed]

FINDINGS: VASCULAR

Aorta: Normal caliber of the descending thoracic aorta. Normal
caliber of the aorta at the hiatus. Infrarenal abdominal aortic
aneurysm. There are 2 focal areas of aneurysmal dilatation involving
the infrarenal abdominal aorta. The proximal infrarenal aneurysm
segment measures up to 3.9 cm on sequence 5, image 58. Infrarenal
abdominal aorta is mildly tortuous. The larger aneurysm is just
distal to the IMA takeoff and measures 5.7 x 5.0 cm on sequence 5,
image 84. Aneurysm terminates at the aortic bifurcation. Diffuse
atherosclerotic disease with in the abdominal aorta with a small
amount of mural thrombus. Negative for an aortic dissection.

Celiac: Celiac trunk is patent without significant stenosis.
Atherosclerotic disease involving the celiac trunk, splenic artery
and common hepatic artery. Left gastric artery is patent.

SMA: SMA is patent. There is mixed plaque involving the origin and
proximal aspect of the SMA. There is approximately 40% stenosis
involving the origin and proximal SMA. Main branches of the SMA are
patent.

Renals: Single right renal artery is patent with atherosclerotic
plaque at the origin and at least mild stenosis. Main left renal
artery originates just distal to the right renal artery. The left
main left renal artery is patent with mild atherosclerotic disease
and no significant stenosis. Accessory left renal artery supplying
the left kidney lower pole. The accessory left renal artery is
distal to the main left renal artery but proximal to the IMA.

IMA: IMA is patent.  Narrowing at the origin.

Inflow: Common, external and internal iliac arteries are patent with
diffuse calcified plaque. Diameter of the external iliac arteries is
approximately 9 mm bilaterally. There is approximately 50% narrowing
of the right external iliac artery near the origin of the inferior
epigastric artery.

Proximal Outflow: Common femoral arteries and proximal femoral
arteries are patent bilaterally.

Veins: No obvious venous abnormality within the limitations of this
arterial phase study.

Review of the MIP images confirms the above findings.

NON-VASCULAR

Lower chest: Small amount of volume loss at the base of the right
middle lobe. Otherwise, the lung bases are clear. No pleural
effusions.

Hepatobiliary: Normal appearance of the liver and gallbladder. No
biliary dilatation.

Pancreas: Focal dilatation of the main pancreatic duct involving the
pancreatic body and neck region. The duct roughly measures up to 1
cm on sequence 8, image 71. No evidence for pancreatic inflammation.

Spleen: Normal in size without focal abnormality.

Adrenals/Urinary Tract: Normal adrenal glands. Urinary bladder is
unremarkable. Normal appearance of both kidneys without
hydronephrosis. No suspicious renal lesions.

Stomach/Bowel: Small hiatal hernia. Colonic diverticulosis without
acute colonic inflammation. Evidence for an appendectomy.

Lymphatic: No lymph node enlargement in the abdomen or pelvis.

Reproductive: Prostate is mildly prominent for size.

Other: Left inguinal hernia containing fat. No ascites. No free air.

Musculoskeletal: Mild anterolisthesis at L4-L5 appears to be
secondary to facet arthropathy. Disc space narrowing and vacuum disc
phenomenon at L5-S1. Anterior wedge deformity involving the T12
vertebral body is suggestive for an old compression fracture. Disc
space narrowing at T11-T12.
IMPRESSION: VASCULAR

Infrarenal abdominal aortic aneurysm. Two focal areas of dilatation
involving the infrarenal abdominal aorta. The largest aneurysm
formation is more distal and measures up to 5.7 cm.

Accessory left renal artery supplying the left kidney lower pole.

Mild stenosis in the SMA.

NON-VASCULAR

**An incidental finding of potential clinical significance has been
found. There appears to be dilatation of the pancreatic duct at the
pancreatic head and neck region measuring up to 1 cm. Findings are
nonspecific but an obstructive lesion or intraductal IPMN cannot be
excluded. Recommend further characterization with a pancreatic MRI
with MRCP.**

## 2018-12-10 DIAGNOSIS — N182 Chronic kidney disease, stage 2 (mild): Secondary | ICD-10-CM | POA: Diagnosis not present

## 2018-12-10 DIAGNOSIS — E785 Hyperlipidemia, unspecified: Secondary | ICD-10-CM | POA: Diagnosis not present

## 2018-12-10 DIAGNOSIS — I714 Abdominal aortic aneurysm, without rupture: Secondary | ICD-10-CM | POA: Diagnosis not present

## 2018-12-10 DIAGNOSIS — E1165 Type 2 diabetes mellitus with hyperglycemia: Secondary | ICD-10-CM | POA: Diagnosis not present

## 2018-12-10 DIAGNOSIS — R69 Illness, unspecified: Secondary | ICD-10-CM | POA: Diagnosis not present

## 2018-12-10 DIAGNOSIS — Z79899 Other long term (current) drug therapy: Secondary | ICD-10-CM | POA: Diagnosis not present

## 2018-12-10 DIAGNOSIS — I5030 Unspecified diastolic (congestive) heart failure: Secondary | ICD-10-CM | POA: Diagnosis not present

## 2018-12-10 DIAGNOSIS — J441 Chronic obstructive pulmonary disease with (acute) exacerbation: Secondary | ICD-10-CM | POA: Diagnosis not present

## 2018-12-10 DIAGNOSIS — J9611 Chronic respiratory failure with hypoxia: Secondary | ICD-10-CM | POA: Diagnosis not present

## 2018-12-10 DIAGNOSIS — E1122 Type 2 diabetes mellitus with diabetic chronic kidney disease: Secondary | ICD-10-CM | POA: Diagnosis not present

## 2018-12-10 DIAGNOSIS — M109 Gout, unspecified: Secondary | ICD-10-CM | POA: Diagnosis not present

## 2018-12-10 DIAGNOSIS — Z139 Encounter for screening, unspecified: Secondary | ICD-10-CM | POA: Diagnosis not present

## 2018-12-17 DIAGNOSIS — J441 Chronic obstructive pulmonary disease with (acute) exacerbation: Secondary | ICD-10-CM | POA: Diagnosis not present

## 2018-12-19 DIAGNOSIS — J449 Chronic obstructive pulmonary disease, unspecified: Secondary | ICD-10-CM | POA: Diagnosis not present

## 2018-12-27 DIAGNOSIS — M545 Low back pain: Secondary | ICD-10-CM | POA: Diagnosis not present

## 2019-01-08 DIAGNOSIS — M545 Low back pain: Secondary | ICD-10-CM | POA: Diagnosis not present

## 2019-01-08 DIAGNOSIS — M47816 Spondylosis without myelopathy or radiculopathy, lumbar region: Secondary | ICD-10-CM | POA: Diagnosis not present

## 2019-01-08 DIAGNOSIS — S22000A Wedge compression fracture of unspecified thoracic vertebra, initial encounter for closed fracture: Secondary | ICD-10-CM | POA: Diagnosis not present

## 2019-01-08 DIAGNOSIS — G894 Chronic pain syndrome: Secondary | ICD-10-CM | POA: Diagnosis not present

## 2019-01-19 DIAGNOSIS — J449 Chronic obstructive pulmonary disease, unspecified: Secondary | ICD-10-CM | POA: Diagnosis not present

## 2019-02-07 DIAGNOSIS — G894 Chronic pain syndrome: Secondary | ICD-10-CM | POA: Diagnosis not present

## 2019-02-07 DIAGNOSIS — M47816 Spondylosis without myelopathy or radiculopathy, lumbar region: Secondary | ICD-10-CM | POA: Diagnosis not present

## 2019-02-07 DIAGNOSIS — M545 Low back pain: Secondary | ICD-10-CM | POA: Diagnosis not present

## 2019-02-07 DIAGNOSIS — S22000A Wedge compression fracture of unspecified thoracic vertebra, initial encounter for closed fracture: Secondary | ICD-10-CM | POA: Diagnosis not present

## 2019-02-18 DIAGNOSIS — R69 Illness, unspecified: Secondary | ICD-10-CM | POA: Diagnosis not present

## 2019-02-19 DIAGNOSIS — J449 Chronic obstructive pulmonary disease, unspecified: Secondary | ICD-10-CM | POA: Diagnosis not present

## 2019-03-20 DIAGNOSIS — I5030 Unspecified diastolic (congestive) heart failure: Secondary | ICD-10-CM | POA: Diagnosis not present

## 2019-03-20 DIAGNOSIS — N182 Chronic kidney disease, stage 2 (mild): Secondary | ICD-10-CM | POA: Diagnosis not present

## 2019-03-20 DIAGNOSIS — R69 Illness, unspecified: Secondary | ICD-10-CM | POA: Diagnosis not present

## 2019-03-20 DIAGNOSIS — J449 Chronic obstructive pulmonary disease, unspecified: Secondary | ICD-10-CM | POA: Diagnosis not present

## 2019-03-20 DIAGNOSIS — E1165 Type 2 diabetes mellitus with hyperglycemia: Secondary | ICD-10-CM | POA: Diagnosis not present

## 2019-03-20 DIAGNOSIS — E1122 Type 2 diabetes mellitus with diabetic chronic kidney disease: Secondary | ICD-10-CM | POA: Diagnosis not present

## 2019-03-20 DIAGNOSIS — E785 Hyperlipidemia, unspecified: Secondary | ICD-10-CM | POA: Diagnosis not present

## 2019-03-20 DIAGNOSIS — J9611 Chronic respiratory failure with hypoxia: Secondary | ICD-10-CM | POA: Diagnosis not present

## 2019-03-20 DIAGNOSIS — Z79899 Other long term (current) drug therapy: Secondary | ICD-10-CM | POA: Diagnosis not present

## 2019-03-20 DIAGNOSIS — I714 Abdominal aortic aneurysm, without rupture: Secondary | ICD-10-CM | POA: Diagnosis not present

## 2019-03-20 DIAGNOSIS — Z23 Encounter for immunization: Secondary | ICD-10-CM | POA: Diagnosis not present

## 2019-03-20 DIAGNOSIS — M109 Gout, unspecified: Secondary | ICD-10-CM | POA: Diagnosis not present

## 2019-03-21 DIAGNOSIS — J449 Chronic obstructive pulmonary disease, unspecified: Secondary | ICD-10-CM | POA: Diagnosis not present

## 2019-04-18 DIAGNOSIS — E1151 Type 2 diabetes mellitus with diabetic peripheral angiopathy without gangrene: Secondary | ICD-10-CM | POA: Diagnosis not present

## 2019-04-18 DIAGNOSIS — J441 Chronic obstructive pulmonary disease with (acute) exacerbation: Secondary | ICD-10-CM | POA: Diagnosis not present

## 2019-04-18 DIAGNOSIS — N182 Chronic kidney disease, stage 2 (mild): Secondary | ICD-10-CM | POA: Diagnosis not present

## 2019-04-18 DIAGNOSIS — J449 Chronic obstructive pulmonary disease, unspecified: Secondary | ICD-10-CM | POA: Diagnosis not present

## 2019-04-18 DIAGNOSIS — I2510 Atherosclerotic heart disease of native coronary artery without angina pectoris: Secondary | ICD-10-CM | POA: Diagnosis not present

## 2019-04-18 DIAGNOSIS — M109 Gout, unspecified: Secondary | ICD-10-CM | POA: Diagnosis not present

## 2019-04-18 DIAGNOSIS — I130 Hypertensive heart and chronic kidney disease with heart failure and stage 1 through stage 4 chronic kidney disease, or unspecified chronic kidney disease: Secondary | ICD-10-CM | POA: Diagnosis not present

## 2019-04-18 DIAGNOSIS — L97519 Non-pressure chronic ulcer of other part of right foot with unspecified severity: Secondary | ICD-10-CM | POA: Diagnosis not present

## 2019-04-18 DIAGNOSIS — E11621 Type 2 diabetes mellitus with foot ulcer: Secondary | ICD-10-CM | POA: Diagnosis not present

## 2019-04-18 DIAGNOSIS — E1122 Type 2 diabetes mellitus with diabetic chronic kidney disease: Secondary | ICD-10-CM | POA: Diagnosis not present

## 2019-04-18 DIAGNOSIS — I5030 Unspecified diastolic (congestive) heart failure: Secondary | ICD-10-CM | POA: Diagnosis not present

## 2019-04-18 DIAGNOSIS — E11319 Type 2 diabetes mellitus with unspecified diabetic retinopathy without macular edema: Secondary | ICD-10-CM | POA: Diagnosis not present

## 2019-04-18 DIAGNOSIS — N183 Chronic kidney disease, stage 3 unspecified: Secondary | ICD-10-CM | POA: Diagnosis not present

## 2019-04-18 DIAGNOSIS — J9611 Chronic respiratory failure with hypoxia: Secondary | ICD-10-CM | POA: Diagnosis not present

## 2019-04-19 DIAGNOSIS — J449 Chronic obstructive pulmonary disease, unspecified: Secondary | ICD-10-CM | POA: Diagnosis not present

## 2019-04-19 DIAGNOSIS — J9611 Chronic respiratory failure with hypoxia: Secondary | ICD-10-CM | POA: Diagnosis not present

## 2019-04-19 DIAGNOSIS — I2510 Atherosclerotic heart disease of native coronary artery without angina pectoris: Secondary | ICD-10-CM | POA: Diagnosis not present

## 2019-04-19 DIAGNOSIS — E11319 Type 2 diabetes mellitus with unspecified diabetic retinopathy without macular edema: Secondary | ICD-10-CM | POA: Diagnosis not present

## 2019-04-19 DIAGNOSIS — N182 Chronic kidney disease, stage 2 (mild): Secondary | ICD-10-CM | POA: Diagnosis not present

## 2019-04-19 DIAGNOSIS — M109 Gout, unspecified: Secondary | ICD-10-CM | POA: Diagnosis not present

## 2019-04-19 DIAGNOSIS — I5030 Unspecified diastolic (congestive) heart failure: Secondary | ICD-10-CM | POA: Diagnosis not present

## 2019-04-19 DIAGNOSIS — I130 Hypertensive heart and chronic kidney disease with heart failure and stage 1 through stage 4 chronic kidney disease, or unspecified chronic kidney disease: Secondary | ICD-10-CM | POA: Diagnosis not present

## 2019-04-19 DIAGNOSIS — E1122 Type 2 diabetes mellitus with diabetic chronic kidney disease: Secondary | ICD-10-CM | POA: Diagnosis not present

## 2019-04-19 DIAGNOSIS — E1151 Type 2 diabetes mellitus with diabetic peripheral angiopathy without gangrene: Secondary | ICD-10-CM | POA: Diagnosis not present

## 2019-04-21 DIAGNOSIS — J449 Chronic obstructive pulmonary disease, unspecified: Secondary | ICD-10-CM | POA: Diagnosis not present

## 2019-04-22 DIAGNOSIS — I2510 Atherosclerotic heart disease of native coronary artery without angina pectoris: Secondary | ICD-10-CM | POA: Diagnosis not present

## 2019-04-22 DIAGNOSIS — I5030 Unspecified diastolic (congestive) heart failure: Secondary | ICD-10-CM | POA: Diagnosis not present

## 2019-04-22 DIAGNOSIS — E1122 Type 2 diabetes mellitus with diabetic chronic kidney disease: Secondary | ICD-10-CM | POA: Diagnosis not present

## 2019-04-22 DIAGNOSIS — M109 Gout, unspecified: Secondary | ICD-10-CM | POA: Diagnosis not present

## 2019-04-22 DIAGNOSIS — N182 Chronic kidney disease, stage 2 (mild): Secondary | ICD-10-CM | POA: Diagnosis not present

## 2019-04-22 DIAGNOSIS — J9611 Chronic respiratory failure with hypoxia: Secondary | ICD-10-CM | POA: Diagnosis not present

## 2019-04-22 DIAGNOSIS — J449 Chronic obstructive pulmonary disease, unspecified: Secondary | ICD-10-CM | POA: Diagnosis not present

## 2019-04-22 DIAGNOSIS — E11319 Type 2 diabetes mellitus with unspecified diabetic retinopathy without macular edema: Secondary | ICD-10-CM | POA: Diagnosis not present

## 2019-04-22 DIAGNOSIS — E1151 Type 2 diabetes mellitus with diabetic peripheral angiopathy without gangrene: Secondary | ICD-10-CM | POA: Diagnosis not present

## 2019-04-22 DIAGNOSIS — I130 Hypertensive heart and chronic kidney disease with heart failure and stage 1 through stage 4 chronic kidney disease, or unspecified chronic kidney disease: Secondary | ICD-10-CM | POA: Diagnosis not present

## 2019-04-23 DIAGNOSIS — N182 Chronic kidney disease, stage 2 (mild): Secondary | ICD-10-CM | POA: Diagnosis not present

## 2019-04-23 DIAGNOSIS — I5030 Unspecified diastolic (congestive) heart failure: Secondary | ICD-10-CM | POA: Diagnosis not present

## 2019-04-23 DIAGNOSIS — E11319 Type 2 diabetes mellitus with unspecified diabetic retinopathy without macular edema: Secondary | ICD-10-CM | POA: Diagnosis not present

## 2019-04-23 DIAGNOSIS — E1151 Type 2 diabetes mellitus with diabetic peripheral angiopathy without gangrene: Secondary | ICD-10-CM | POA: Diagnosis not present

## 2019-04-23 DIAGNOSIS — J449 Chronic obstructive pulmonary disease, unspecified: Secondary | ICD-10-CM | POA: Diagnosis not present

## 2019-04-23 DIAGNOSIS — M109 Gout, unspecified: Secondary | ICD-10-CM | POA: Diagnosis not present

## 2019-04-23 DIAGNOSIS — E1122 Type 2 diabetes mellitus with diabetic chronic kidney disease: Secondary | ICD-10-CM | POA: Diagnosis not present

## 2019-04-23 DIAGNOSIS — I130 Hypertensive heart and chronic kidney disease with heart failure and stage 1 through stage 4 chronic kidney disease, or unspecified chronic kidney disease: Secondary | ICD-10-CM | POA: Diagnosis not present

## 2019-04-23 DIAGNOSIS — J9611 Chronic respiratory failure with hypoxia: Secondary | ICD-10-CM | POA: Diagnosis not present

## 2019-04-23 DIAGNOSIS — I2510 Atherosclerotic heart disease of native coronary artery without angina pectoris: Secondary | ICD-10-CM | POA: Diagnosis not present

## 2019-04-24 DIAGNOSIS — N182 Chronic kidney disease, stage 2 (mild): Secondary | ICD-10-CM | POA: Diagnosis not present

## 2019-04-24 DIAGNOSIS — I2510 Atherosclerotic heart disease of native coronary artery without angina pectoris: Secondary | ICD-10-CM | POA: Diagnosis not present

## 2019-04-24 DIAGNOSIS — J9611 Chronic respiratory failure with hypoxia: Secondary | ICD-10-CM | POA: Diagnosis not present

## 2019-04-24 DIAGNOSIS — M109 Gout, unspecified: Secondary | ICD-10-CM | POA: Diagnosis not present

## 2019-04-24 DIAGNOSIS — I5030 Unspecified diastolic (congestive) heart failure: Secondary | ICD-10-CM | POA: Diagnosis not present

## 2019-04-24 DIAGNOSIS — E11319 Type 2 diabetes mellitus with unspecified diabetic retinopathy without macular edema: Secondary | ICD-10-CM | POA: Diagnosis not present

## 2019-04-24 DIAGNOSIS — J449 Chronic obstructive pulmonary disease, unspecified: Secondary | ICD-10-CM | POA: Diagnosis not present

## 2019-04-24 DIAGNOSIS — E1151 Type 2 diabetes mellitus with diabetic peripheral angiopathy without gangrene: Secondary | ICD-10-CM | POA: Diagnosis not present

## 2019-04-24 DIAGNOSIS — E1122 Type 2 diabetes mellitus with diabetic chronic kidney disease: Secondary | ICD-10-CM | POA: Diagnosis not present

## 2019-04-24 DIAGNOSIS — I130 Hypertensive heart and chronic kidney disease with heart failure and stage 1 through stage 4 chronic kidney disease, or unspecified chronic kidney disease: Secondary | ICD-10-CM | POA: Diagnosis not present

## 2019-04-25 DIAGNOSIS — E1151 Type 2 diabetes mellitus with diabetic peripheral angiopathy without gangrene: Secondary | ICD-10-CM | POA: Diagnosis not present

## 2019-04-25 DIAGNOSIS — I130 Hypertensive heart and chronic kidney disease with heart failure and stage 1 through stage 4 chronic kidney disease, or unspecified chronic kidney disease: Secondary | ICD-10-CM | POA: Diagnosis not present

## 2019-04-25 DIAGNOSIS — J9611 Chronic respiratory failure with hypoxia: Secondary | ICD-10-CM | POA: Diagnosis not present

## 2019-04-25 DIAGNOSIS — E11319 Type 2 diabetes mellitus with unspecified diabetic retinopathy without macular edema: Secondary | ICD-10-CM | POA: Diagnosis not present

## 2019-04-25 DIAGNOSIS — N182 Chronic kidney disease, stage 2 (mild): Secondary | ICD-10-CM | POA: Diagnosis not present

## 2019-04-25 DIAGNOSIS — I2510 Atherosclerotic heart disease of native coronary artery without angina pectoris: Secondary | ICD-10-CM | POA: Diagnosis not present

## 2019-04-25 DIAGNOSIS — I5030 Unspecified diastolic (congestive) heart failure: Secondary | ICD-10-CM | POA: Diagnosis not present

## 2019-04-25 DIAGNOSIS — E1122 Type 2 diabetes mellitus with diabetic chronic kidney disease: Secondary | ICD-10-CM | POA: Diagnosis not present

## 2019-04-25 DIAGNOSIS — M109 Gout, unspecified: Secondary | ICD-10-CM | POA: Diagnosis not present

## 2019-04-25 DIAGNOSIS — J449 Chronic obstructive pulmonary disease, unspecified: Secondary | ICD-10-CM | POA: Diagnosis not present

## 2019-04-26 DIAGNOSIS — I5030 Unspecified diastolic (congestive) heart failure: Secondary | ICD-10-CM | POA: Diagnosis not present

## 2019-04-26 DIAGNOSIS — N182 Chronic kidney disease, stage 2 (mild): Secondary | ICD-10-CM | POA: Diagnosis not present

## 2019-04-26 DIAGNOSIS — J449 Chronic obstructive pulmonary disease, unspecified: Secondary | ICD-10-CM | POA: Diagnosis not present

## 2019-04-26 DIAGNOSIS — E1122 Type 2 diabetes mellitus with diabetic chronic kidney disease: Secondary | ICD-10-CM | POA: Diagnosis not present

## 2019-04-26 DIAGNOSIS — I2510 Atherosclerotic heart disease of native coronary artery without angina pectoris: Secondary | ICD-10-CM | POA: Diagnosis not present

## 2019-04-26 DIAGNOSIS — M109 Gout, unspecified: Secondary | ICD-10-CM | POA: Diagnosis not present

## 2019-04-26 DIAGNOSIS — E11319 Type 2 diabetes mellitus with unspecified diabetic retinopathy without macular edema: Secondary | ICD-10-CM | POA: Diagnosis not present

## 2019-04-26 DIAGNOSIS — E1151 Type 2 diabetes mellitus with diabetic peripheral angiopathy without gangrene: Secondary | ICD-10-CM | POA: Diagnosis not present

## 2019-04-26 DIAGNOSIS — J9611 Chronic respiratory failure with hypoxia: Secondary | ICD-10-CM | POA: Diagnosis not present

## 2019-04-26 DIAGNOSIS — I130 Hypertensive heart and chronic kidney disease with heart failure and stage 1 through stage 4 chronic kidney disease, or unspecified chronic kidney disease: Secondary | ICD-10-CM | POA: Diagnosis not present

## 2019-04-29 DIAGNOSIS — N182 Chronic kidney disease, stage 2 (mild): Secondary | ICD-10-CM | POA: Diagnosis not present

## 2019-04-29 DIAGNOSIS — I2510 Atherosclerotic heart disease of native coronary artery without angina pectoris: Secondary | ICD-10-CM | POA: Diagnosis not present

## 2019-04-29 DIAGNOSIS — E1151 Type 2 diabetes mellitus with diabetic peripheral angiopathy without gangrene: Secondary | ICD-10-CM | POA: Diagnosis not present

## 2019-04-29 DIAGNOSIS — E11319 Type 2 diabetes mellitus with unspecified diabetic retinopathy without macular edema: Secondary | ICD-10-CM | POA: Diagnosis not present

## 2019-04-29 DIAGNOSIS — J449 Chronic obstructive pulmonary disease, unspecified: Secondary | ICD-10-CM | POA: Diagnosis not present

## 2019-04-29 DIAGNOSIS — E1122 Type 2 diabetes mellitus with diabetic chronic kidney disease: Secondary | ICD-10-CM | POA: Diagnosis not present

## 2019-04-29 DIAGNOSIS — J9611 Chronic respiratory failure with hypoxia: Secondary | ICD-10-CM | POA: Diagnosis not present

## 2019-04-29 DIAGNOSIS — I5030 Unspecified diastolic (congestive) heart failure: Secondary | ICD-10-CM | POA: Diagnosis not present

## 2019-04-29 DIAGNOSIS — M109 Gout, unspecified: Secondary | ICD-10-CM | POA: Diagnosis not present

## 2019-04-29 DIAGNOSIS — I130 Hypertensive heart and chronic kidney disease with heart failure and stage 1 through stage 4 chronic kidney disease, or unspecified chronic kidney disease: Secondary | ICD-10-CM | POA: Diagnosis not present

## 2019-04-30 DIAGNOSIS — N182 Chronic kidney disease, stage 2 (mild): Secondary | ICD-10-CM | POA: Diagnosis not present

## 2019-04-30 DIAGNOSIS — J9611 Chronic respiratory failure with hypoxia: Secondary | ICD-10-CM | POA: Diagnosis not present

## 2019-04-30 DIAGNOSIS — I5030 Unspecified diastolic (congestive) heart failure: Secondary | ICD-10-CM | POA: Diagnosis not present

## 2019-04-30 DIAGNOSIS — E11319 Type 2 diabetes mellitus with unspecified diabetic retinopathy without macular edema: Secondary | ICD-10-CM | POA: Diagnosis not present

## 2019-04-30 DIAGNOSIS — E1151 Type 2 diabetes mellitus with diabetic peripheral angiopathy without gangrene: Secondary | ICD-10-CM | POA: Diagnosis not present

## 2019-04-30 DIAGNOSIS — J449 Chronic obstructive pulmonary disease, unspecified: Secondary | ICD-10-CM | POA: Diagnosis not present

## 2019-04-30 DIAGNOSIS — I2510 Atherosclerotic heart disease of native coronary artery without angina pectoris: Secondary | ICD-10-CM | POA: Diagnosis not present

## 2019-04-30 DIAGNOSIS — M109 Gout, unspecified: Secondary | ICD-10-CM | POA: Diagnosis not present

## 2019-04-30 DIAGNOSIS — I130 Hypertensive heart and chronic kidney disease with heart failure and stage 1 through stage 4 chronic kidney disease, or unspecified chronic kidney disease: Secondary | ICD-10-CM | POA: Diagnosis not present

## 2019-04-30 DIAGNOSIS — E1122 Type 2 diabetes mellitus with diabetic chronic kidney disease: Secondary | ICD-10-CM | POA: Diagnosis not present

## 2019-05-02 DIAGNOSIS — J449 Chronic obstructive pulmonary disease, unspecified: Secondary | ICD-10-CM | POA: Diagnosis not present

## 2019-05-02 DIAGNOSIS — I5030 Unspecified diastolic (congestive) heart failure: Secondary | ICD-10-CM | POA: Diagnosis not present

## 2019-05-02 DIAGNOSIS — E11319 Type 2 diabetes mellitus with unspecified diabetic retinopathy without macular edema: Secondary | ICD-10-CM | POA: Diagnosis not present

## 2019-05-02 DIAGNOSIS — N182 Chronic kidney disease, stage 2 (mild): Secondary | ICD-10-CM | POA: Diagnosis not present

## 2019-05-02 DIAGNOSIS — E1151 Type 2 diabetes mellitus with diabetic peripheral angiopathy without gangrene: Secondary | ICD-10-CM | POA: Diagnosis not present

## 2019-05-02 DIAGNOSIS — I130 Hypertensive heart and chronic kidney disease with heart failure and stage 1 through stage 4 chronic kidney disease, or unspecified chronic kidney disease: Secondary | ICD-10-CM | POA: Diagnosis not present

## 2019-05-02 DIAGNOSIS — I2510 Atherosclerotic heart disease of native coronary artery without angina pectoris: Secondary | ICD-10-CM | POA: Diagnosis not present

## 2019-05-02 DIAGNOSIS — E1122 Type 2 diabetes mellitus with diabetic chronic kidney disease: Secondary | ICD-10-CM | POA: Diagnosis not present

## 2019-05-02 DIAGNOSIS — M109 Gout, unspecified: Secondary | ICD-10-CM | POA: Diagnosis not present

## 2019-05-02 DIAGNOSIS — J9611 Chronic respiratory failure with hypoxia: Secondary | ICD-10-CM | POA: Diagnosis not present

## 2019-05-03 DIAGNOSIS — I5030 Unspecified diastolic (congestive) heart failure: Secondary | ICD-10-CM | POA: Diagnosis not present

## 2019-05-03 DIAGNOSIS — E1151 Type 2 diabetes mellitus with diabetic peripheral angiopathy without gangrene: Secondary | ICD-10-CM | POA: Diagnosis not present

## 2019-05-03 DIAGNOSIS — N182 Chronic kidney disease, stage 2 (mild): Secondary | ICD-10-CM | POA: Diagnosis not present

## 2019-05-03 DIAGNOSIS — J449 Chronic obstructive pulmonary disease, unspecified: Secondary | ICD-10-CM | POA: Diagnosis not present

## 2019-05-03 DIAGNOSIS — J9611 Chronic respiratory failure with hypoxia: Secondary | ICD-10-CM | POA: Diagnosis not present

## 2019-05-03 DIAGNOSIS — M109 Gout, unspecified: Secondary | ICD-10-CM | POA: Diagnosis not present

## 2019-05-03 DIAGNOSIS — E11319 Type 2 diabetes mellitus with unspecified diabetic retinopathy without macular edema: Secondary | ICD-10-CM | POA: Diagnosis not present

## 2019-05-03 DIAGNOSIS — E1122 Type 2 diabetes mellitus with diabetic chronic kidney disease: Secondary | ICD-10-CM | POA: Diagnosis not present

## 2019-05-03 DIAGNOSIS — I2510 Atherosclerotic heart disease of native coronary artery without angina pectoris: Secondary | ICD-10-CM | POA: Diagnosis not present

## 2019-05-03 DIAGNOSIS — I130 Hypertensive heart and chronic kidney disease with heart failure and stage 1 through stage 4 chronic kidney disease, or unspecified chronic kidney disease: Secondary | ICD-10-CM | POA: Diagnosis not present

## 2019-05-06 DIAGNOSIS — N182 Chronic kidney disease, stage 2 (mild): Secondary | ICD-10-CM | POA: Diagnosis not present

## 2019-05-06 DIAGNOSIS — J9611 Chronic respiratory failure with hypoxia: Secondary | ICD-10-CM | POA: Diagnosis not present

## 2019-05-06 DIAGNOSIS — E1151 Type 2 diabetes mellitus with diabetic peripheral angiopathy without gangrene: Secondary | ICD-10-CM | POA: Diagnosis not present

## 2019-05-06 DIAGNOSIS — J449 Chronic obstructive pulmonary disease, unspecified: Secondary | ICD-10-CM | POA: Diagnosis not present

## 2019-05-06 DIAGNOSIS — E1122 Type 2 diabetes mellitus with diabetic chronic kidney disease: Secondary | ICD-10-CM | POA: Diagnosis not present

## 2019-05-06 DIAGNOSIS — E11319 Type 2 diabetes mellitus with unspecified diabetic retinopathy without macular edema: Secondary | ICD-10-CM | POA: Diagnosis not present

## 2019-05-06 DIAGNOSIS — I130 Hypertensive heart and chronic kidney disease with heart failure and stage 1 through stage 4 chronic kidney disease, or unspecified chronic kidney disease: Secondary | ICD-10-CM | POA: Diagnosis not present

## 2019-05-06 DIAGNOSIS — I5030 Unspecified diastolic (congestive) heart failure: Secondary | ICD-10-CM | POA: Diagnosis not present

## 2019-05-06 DIAGNOSIS — I2510 Atherosclerotic heart disease of native coronary artery without angina pectoris: Secondary | ICD-10-CM | POA: Diagnosis not present

## 2019-05-06 DIAGNOSIS — M109 Gout, unspecified: Secondary | ICD-10-CM | POA: Diagnosis not present

## 2019-05-07 DIAGNOSIS — M109 Gout, unspecified: Secondary | ICD-10-CM | POA: Diagnosis not present

## 2019-05-07 DIAGNOSIS — I5030 Unspecified diastolic (congestive) heart failure: Secondary | ICD-10-CM | POA: Diagnosis not present

## 2019-05-07 DIAGNOSIS — J9611 Chronic respiratory failure with hypoxia: Secondary | ICD-10-CM | POA: Diagnosis not present

## 2019-05-07 DIAGNOSIS — E1151 Type 2 diabetes mellitus with diabetic peripheral angiopathy without gangrene: Secondary | ICD-10-CM | POA: Diagnosis not present

## 2019-05-07 DIAGNOSIS — E1122 Type 2 diabetes mellitus with diabetic chronic kidney disease: Secondary | ICD-10-CM | POA: Diagnosis not present

## 2019-05-07 DIAGNOSIS — E11319 Type 2 diabetes mellitus with unspecified diabetic retinopathy without macular edema: Secondary | ICD-10-CM | POA: Diagnosis not present

## 2019-05-07 DIAGNOSIS — I2510 Atherosclerotic heart disease of native coronary artery without angina pectoris: Secondary | ICD-10-CM | POA: Diagnosis not present

## 2019-05-07 DIAGNOSIS — N182 Chronic kidney disease, stage 2 (mild): Secondary | ICD-10-CM | POA: Diagnosis not present

## 2019-05-07 DIAGNOSIS — I130 Hypertensive heart and chronic kidney disease with heart failure and stage 1 through stage 4 chronic kidney disease, or unspecified chronic kidney disease: Secondary | ICD-10-CM | POA: Diagnosis not present

## 2019-05-07 DIAGNOSIS — J449 Chronic obstructive pulmonary disease, unspecified: Secondary | ICD-10-CM | POA: Diagnosis not present

## 2019-05-08 DIAGNOSIS — J449 Chronic obstructive pulmonary disease, unspecified: Secondary | ICD-10-CM | POA: Diagnosis not present

## 2019-05-08 DIAGNOSIS — M109 Gout, unspecified: Secondary | ICD-10-CM | POA: Diagnosis not present

## 2019-05-08 DIAGNOSIS — I130 Hypertensive heart and chronic kidney disease with heart failure and stage 1 through stage 4 chronic kidney disease, or unspecified chronic kidney disease: Secondary | ICD-10-CM | POA: Diagnosis not present

## 2019-05-08 DIAGNOSIS — N182 Chronic kidney disease, stage 2 (mild): Secondary | ICD-10-CM | POA: Diagnosis not present

## 2019-05-08 DIAGNOSIS — I5030 Unspecified diastolic (congestive) heart failure: Secondary | ICD-10-CM | POA: Diagnosis not present

## 2019-05-08 DIAGNOSIS — J9611 Chronic respiratory failure with hypoxia: Secondary | ICD-10-CM | POA: Diagnosis not present

## 2019-05-08 DIAGNOSIS — E11319 Type 2 diabetes mellitus with unspecified diabetic retinopathy without macular edema: Secondary | ICD-10-CM | POA: Diagnosis not present

## 2019-05-08 DIAGNOSIS — I2510 Atherosclerotic heart disease of native coronary artery without angina pectoris: Secondary | ICD-10-CM | POA: Diagnosis not present

## 2019-05-08 DIAGNOSIS — E1151 Type 2 diabetes mellitus with diabetic peripheral angiopathy without gangrene: Secondary | ICD-10-CM | POA: Diagnosis not present

## 2019-05-08 DIAGNOSIS — E1122 Type 2 diabetes mellitus with diabetic chronic kidney disease: Secondary | ICD-10-CM | POA: Diagnosis not present

## 2019-05-13 DIAGNOSIS — I130 Hypertensive heart and chronic kidney disease with heart failure and stage 1 through stage 4 chronic kidney disease, or unspecified chronic kidney disease: Secondary | ICD-10-CM | POA: Diagnosis not present

## 2019-05-13 DIAGNOSIS — N182 Chronic kidney disease, stage 2 (mild): Secondary | ICD-10-CM | POA: Diagnosis not present

## 2019-05-13 DIAGNOSIS — E1151 Type 2 diabetes mellitus with diabetic peripheral angiopathy without gangrene: Secondary | ICD-10-CM | POA: Diagnosis not present

## 2019-05-13 DIAGNOSIS — M109 Gout, unspecified: Secondary | ICD-10-CM | POA: Diagnosis not present

## 2019-05-13 DIAGNOSIS — J9611 Chronic respiratory failure with hypoxia: Secondary | ICD-10-CM | POA: Diagnosis not present

## 2019-05-13 DIAGNOSIS — E11319 Type 2 diabetes mellitus with unspecified diabetic retinopathy without macular edema: Secondary | ICD-10-CM | POA: Diagnosis not present

## 2019-05-13 DIAGNOSIS — E1122 Type 2 diabetes mellitus with diabetic chronic kidney disease: Secondary | ICD-10-CM | POA: Diagnosis not present

## 2019-05-13 DIAGNOSIS — I5030 Unspecified diastolic (congestive) heart failure: Secondary | ICD-10-CM | POA: Diagnosis not present

## 2019-05-13 DIAGNOSIS — I2510 Atherosclerotic heart disease of native coronary artery without angina pectoris: Secondary | ICD-10-CM | POA: Diagnosis not present

## 2019-05-13 DIAGNOSIS — J449 Chronic obstructive pulmonary disease, unspecified: Secondary | ICD-10-CM | POA: Diagnosis not present

## 2019-05-16 DIAGNOSIS — E11621 Type 2 diabetes mellitus with foot ulcer: Secondary | ICD-10-CM | POA: Diagnosis not present

## 2019-05-16 DIAGNOSIS — J449 Chronic obstructive pulmonary disease, unspecified: Secondary | ICD-10-CM | POA: Diagnosis not present

## 2019-05-16 DIAGNOSIS — J9611 Chronic respiratory failure with hypoxia: Secondary | ICD-10-CM | POA: Diagnosis not present

## 2019-05-16 DIAGNOSIS — E1122 Type 2 diabetes mellitus with diabetic chronic kidney disease: Secondary | ICD-10-CM | POA: Diagnosis not present

## 2019-05-16 DIAGNOSIS — I5030 Unspecified diastolic (congestive) heart failure: Secondary | ICD-10-CM | POA: Diagnosis not present

## 2019-05-16 DIAGNOSIS — E11319 Type 2 diabetes mellitus with unspecified diabetic retinopathy without macular edema: Secondary | ICD-10-CM | POA: Diagnosis not present

## 2019-05-16 DIAGNOSIS — L97519 Non-pressure chronic ulcer of other part of right foot with unspecified severity: Secondary | ICD-10-CM | POA: Diagnosis not present

## 2019-05-16 DIAGNOSIS — I130 Hypertensive heart and chronic kidney disease with heart failure and stage 1 through stage 4 chronic kidney disease, or unspecified chronic kidney disease: Secondary | ICD-10-CM | POA: Diagnosis not present

## 2019-05-16 DIAGNOSIS — E1151 Type 2 diabetes mellitus with diabetic peripheral angiopathy without gangrene: Secondary | ICD-10-CM | POA: Diagnosis not present

## 2019-05-16 DIAGNOSIS — N182 Chronic kidney disease, stage 2 (mild): Secondary | ICD-10-CM | POA: Diagnosis not present

## 2019-05-17 DIAGNOSIS — E1122 Type 2 diabetes mellitus with diabetic chronic kidney disease: Secondary | ICD-10-CM | POA: Diagnosis not present

## 2019-05-17 DIAGNOSIS — E11621 Type 2 diabetes mellitus with foot ulcer: Secondary | ICD-10-CM | POA: Diagnosis not present

## 2019-05-17 DIAGNOSIS — J9611 Chronic respiratory failure with hypoxia: Secondary | ICD-10-CM | POA: Diagnosis not present

## 2019-05-17 DIAGNOSIS — I130 Hypertensive heart and chronic kidney disease with heart failure and stage 1 through stage 4 chronic kidney disease, or unspecified chronic kidney disease: Secondary | ICD-10-CM | POA: Diagnosis not present

## 2019-05-17 DIAGNOSIS — E11319 Type 2 diabetes mellitus with unspecified diabetic retinopathy without macular edema: Secondary | ICD-10-CM | POA: Diagnosis not present

## 2019-05-17 DIAGNOSIS — E1151 Type 2 diabetes mellitus with diabetic peripheral angiopathy without gangrene: Secondary | ICD-10-CM | POA: Diagnosis not present

## 2019-05-17 DIAGNOSIS — J449 Chronic obstructive pulmonary disease, unspecified: Secondary | ICD-10-CM | POA: Diagnosis not present

## 2019-05-17 DIAGNOSIS — I5030 Unspecified diastolic (congestive) heart failure: Secondary | ICD-10-CM | POA: Diagnosis not present

## 2019-05-17 DIAGNOSIS — L97519 Non-pressure chronic ulcer of other part of right foot with unspecified severity: Secondary | ICD-10-CM | POA: Diagnosis not present

## 2019-05-17 DIAGNOSIS — N182 Chronic kidney disease, stage 2 (mild): Secondary | ICD-10-CM | POA: Diagnosis not present

## 2019-05-20 DIAGNOSIS — N182 Chronic kidney disease, stage 2 (mild): Secondary | ICD-10-CM | POA: Diagnosis not present

## 2019-05-20 DIAGNOSIS — J449 Chronic obstructive pulmonary disease, unspecified: Secondary | ICD-10-CM | POA: Diagnosis not present

## 2019-05-20 DIAGNOSIS — E1151 Type 2 diabetes mellitus with diabetic peripheral angiopathy without gangrene: Secondary | ICD-10-CM | POA: Diagnosis not present

## 2019-05-20 DIAGNOSIS — E1122 Type 2 diabetes mellitus with diabetic chronic kidney disease: Secondary | ICD-10-CM | POA: Diagnosis not present

## 2019-05-20 DIAGNOSIS — J9611 Chronic respiratory failure with hypoxia: Secondary | ICD-10-CM | POA: Diagnosis not present

## 2019-05-20 DIAGNOSIS — I5030 Unspecified diastolic (congestive) heart failure: Secondary | ICD-10-CM | POA: Diagnosis not present

## 2019-05-20 DIAGNOSIS — E11621 Type 2 diabetes mellitus with foot ulcer: Secondary | ICD-10-CM | POA: Diagnosis not present

## 2019-05-20 DIAGNOSIS — L97519 Non-pressure chronic ulcer of other part of right foot with unspecified severity: Secondary | ICD-10-CM | POA: Diagnosis not present

## 2019-05-20 DIAGNOSIS — I130 Hypertensive heart and chronic kidney disease with heart failure and stage 1 through stage 4 chronic kidney disease, or unspecified chronic kidney disease: Secondary | ICD-10-CM | POA: Diagnosis not present

## 2019-05-20 DIAGNOSIS — E11319 Type 2 diabetes mellitus with unspecified diabetic retinopathy without macular edema: Secondary | ICD-10-CM | POA: Diagnosis not present

## 2019-05-21 DIAGNOSIS — J449 Chronic obstructive pulmonary disease, unspecified: Secondary | ICD-10-CM | POA: Diagnosis not present

## 2019-05-23 DIAGNOSIS — J449 Chronic obstructive pulmonary disease, unspecified: Secondary | ICD-10-CM | POA: Diagnosis not present

## 2019-05-23 DIAGNOSIS — E11319 Type 2 diabetes mellitus with unspecified diabetic retinopathy without macular edema: Secondary | ICD-10-CM | POA: Diagnosis not present

## 2019-05-23 DIAGNOSIS — I130 Hypertensive heart and chronic kidney disease with heart failure and stage 1 through stage 4 chronic kidney disease, or unspecified chronic kidney disease: Secondary | ICD-10-CM | POA: Diagnosis not present

## 2019-05-23 DIAGNOSIS — N182 Chronic kidney disease, stage 2 (mild): Secondary | ICD-10-CM | POA: Diagnosis not present

## 2019-05-23 DIAGNOSIS — J9611 Chronic respiratory failure with hypoxia: Secondary | ICD-10-CM | POA: Diagnosis not present

## 2019-05-23 DIAGNOSIS — I5030 Unspecified diastolic (congestive) heart failure: Secondary | ICD-10-CM | POA: Diagnosis not present

## 2019-05-23 DIAGNOSIS — E1122 Type 2 diabetes mellitus with diabetic chronic kidney disease: Secondary | ICD-10-CM | POA: Diagnosis not present

## 2019-05-23 DIAGNOSIS — L97519 Non-pressure chronic ulcer of other part of right foot with unspecified severity: Secondary | ICD-10-CM | POA: Diagnosis not present

## 2019-05-23 DIAGNOSIS — E1151 Type 2 diabetes mellitus with diabetic peripheral angiopathy without gangrene: Secondary | ICD-10-CM | POA: Diagnosis not present

## 2019-05-23 DIAGNOSIS — E11621 Type 2 diabetes mellitus with foot ulcer: Secondary | ICD-10-CM | POA: Diagnosis not present

## 2019-05-27 DIAGNOSIS — E1151 Type 2 diabetes mellitus with diabetic peripheral angiopathy without gangrene: Secondary | ICD-10-CM | POA: Diagnosis not present

## 2019-05-27 DIAGNOSIS — L97519 Non-pressure chronic ulcer of other part of right foot with unspecified severity: Secondary | ICD-10-CM | POA: Diagnosis not present

## 2019-05-27 DIAGNOSIS — E11621 Type 2 diabetes mellitus with foot ulcer: Secondary | ICD-10-CM | POA: Diagnosis not present

## 2019-05-27 DIAGNOSIS — E1122 Type 2 diabetes mellitus with diabetic chronic kidney disease: Secondary | ICD-10-CM | POA: Diagnosis not present

## 2019-05-27 DIAGNOSIS — N182 Chronic kidney disease, stage 2 (mild): Secondary | ICD-10-CM | POA: Diagnosis not present

## 2019-05-27 DIAGNOSIS — J9611 Chronic respiratory failure with hypoxia: Secondary | ICD-10-CM | POA: Diagnosis not present

## 2019-05-27 DIAGNOSIS — E11319 Type 2 diabetes mellitus with unspecified diabetic retinopathy without macular edema: Secondary | ICD-10-CM | POA: Diagnosis not present

## 2019-05-27 DIAGNOSIS — J449 Chronic obstructive pulmonary disease, unspecified: Secondary | ICD-10-CM | POA: Diagnosis not present

## 2019-05-27 DIAGNOSIS — I130 Hypertensive heart and chronic kidney disease with heart failure and stage 1 through stage 4 chronic kidney disease, or unspecified chronic kidney disease: Secondary | ICD-10-CM | POA: Diagnosis not present

## 2019-05-27 DIAGNOSIS — I5030 Unspecified diastolic (congestive) heart failure: Secondary | ICD-10-CM | POA: Diagnosis not present

## 2019-05-30 DIAGNOSIS — E11621 Type 2 diabetes mellitus with foot ulcer: Secondary | ICD-10-CM | POA: Diagnosis not present

## 2019-05-30 DIAGNOSIS — I5030 Unspecified diastolic (congestive) heart failure: Secondary | ICD-10-CM | POA: Diagnosis not present

## 2019-05-30 DIAGNOSIS — N182 Chronic kidney disease, stage 2 (mild): Secondary | ICD-10-CM | POA: Diagnosis not present

## 2019-05-30 DIAGNOSIS — E1151 Type 2 diabetes mellitus with diabetic peripheral angiopathy without gangrene: Secondary | ICD-10-CM | POA: Diagnosis not present

## 2019-05-30 DIAGNOSIS — J449 Chronic obstructive pulmonary disease, unspecified: Secondary | ICD-10-CM | POA: Diagnosis not present

## 2019-05-30 DIAGNOSIS — L97519 Non-pressure chronic ulcer of other part of right foot with unspecified severity: Secondary | ICD-10-CM | POA: Diagnosis not present

## 2019-05-30 DIAGNOSIS — E11319 Type 2 diabetes mellitus with unspecified diabetic retinopathy without macular edema: Secondary | ICD-10-CM | POA: Diagnosis not present

## 2019-05-30 DIAGNOSIS — E1122 Type 2 diabetes mellitus with diabetic chronic kidney disease: Secondary | ICD-10-CM | POA: Diagnosis not present

## 2019-05-30 DIAGNOSIS — J9611 Chronic respiratory failure with hypoxia: Secondary | ICD-10-CM | POA: Diagnosis not present

## 2019-05-30 DIAGNOSIS — I130 Hypertensive heart and chronic kidney disease with heart failure and stage 1 through stage 4 chronic kidney disease, or unspecified chronic kidney disease: Secondary | ICD-10-CM | POA: Diagnosis not present

## 2019-05-31 ENCOUNTER — Ambulatory Visit (INDEPENDENT_AMBULATORY_CARE_PROVIDER_SITE_OTHER): Payer: Medicare HMO | Admitting: Cardiology

## 2019-05-31 ENCOUNTER — Other Ambulatory Visit: Payer: Self-pay

## 2019-05-31 ENCOUNTER — Encounter: Payer: Self-pay | Admitting: Cardiology

## 2019-05-31 VITALS — BP 132/68 | HR 54 | Ht 70.0 in | Wt 213.8 lb

## 2019-05-31 DIAGNOSIS — J9611 Chronic respiratory failure with hypoxia: Secondary | ICD-10-CM | POA: Diagnosis not present

## 2019-05-31 DIAGNOSIS — I130 Hypertensive heart and chronic kidney disease with heart failure and stage 1 through stage 4 chronic kidney disease, or unspecified chronic kidney disease: Secondary | ICD-10-CM | POA: Diagnosis not present

## 2019-05-31 DIAGNOSIS — N183 Chronic kidney disease, stage 3 unspecified: Secondary | ICD-10-CM | POA: Diagnosis not present

## 2019-05-31 DIAGNOSIS — E1122 Type 2 diabetes mellitus with diabetic chronic kidney disease: Secondary | ICD-10-CM | POA: Diagnosis not present

## 2019-05-31 DIAGNOSIS — E11621 Type 2 diabetes mellitus with foot ulcer: Secondary | ICD-10-CM | POA: Diagnosis not present

## 2019-05-31 DIAGNOSIS — I714 Abdominal aortic aneurysm, without rupture, unspecified: Secondary | ICD-10-CM

## 2019-05-31 DIAGNOSIS — I5030 Unspecified diastolic (congestive) heart failure: Secondary | ICD-10-CM | POA: Diagnosis not present

## 2019-05-31 DIAGNOSIS — I129 Hypertensive chronic kidney disease with stage 1 through stage 4 chronic kidney disease, or unspecified chronic kidney disease: Secondary | ICD-10-CM | POA: Diagnosis not present

## 2019-05-31 DIAGNOSIS — L97519 Non-pressure chronic ulcer of other part of right foot with unspecified severity: Secondary | ICD-10-CM | POA: Diagnosis not present

## 2019-05-31 DIAGNOSIS — J449 Chronic obstructive pulmonary disease, unspecified: Secondary | ICD-10-CM | POA: Diagnosis not present

## 2019-05-31 DIAGNOSIS — E1151 Type 2 diabetes mellitus with diabetic peripheral angiopathy without gangrene: Secondary | ICD-10-CM | POA: Diagnosis not present

## 2019-05-31 DIAGNOSIS — E11319 Type 2 diabetes mellitus with unspecified diabetic retinopathy without macular edema: Secondary | ICD-10-CM | POA: Diagnosis not present

## 2019-05-31 DIAGNOSIS — N182 Chronic kidney disease, stage 2 (mild): Secondary | ICD-10-CM | POA: Diagnosis not present

## 2019-05-31 NOTE — Progress Notes (Signed)
Cardiology Office Note:    Date:  05/31/2019   ID:  Ethan Walters, DOB May 31, 1934, MRN [id number]  PCP:  Renaldo Reel, PA  Cardiologist:  Jenean Lindau, MD   Referring MD: Renaldo Reel, PA    ASSESSMENT:    1. Abdominal aortic aneurysm (AAA) without rupture ([place])   2. Diabetes mellitus with stage 3 chronic kidney disease ([place])   3. Benign hypertension with chronic kidney disease, stage III    PLAN:    In order of problems listed above:  1. Abdominal aortic aneurysm: Secondary prevention stressed with the patient.  Importance of compliance with diet and medication stressed and he vocalized understanding.  I reviewed previous abdominal aortic aneurysm reports and want to schedule him for one in the near future and is agreeable and.  I discussed with him symptoms of concern with this diagnosis and he vocalized understanding.  His daughter-in-law came with him and she is very supportive. 2. Essential hypertension: Blood pressure stable Mixed dyslipidemia: Patient on statin therapy and he will be back in the next few days for blood work including fasting lipids COPD on oxygen: He is meticulous with oxygen use and this is followed by his primary care physician. Patient will be seen in follow-up appointment in 6 months or earlier if the patient has any concerns    Medication Adjustments/Labs and Tests Ordered: Current medicines are reviewed at length with the patient today.  Concerns regarding medicines are outlined above.  Orders Placed This Encounter  Procedures  . Basic Metabolic Panel (BMET)  . Lipid Profile  . Hepatic function panel  . VAS [place] AAA DUPLEX   No orders of the defined types were placed in this encounter.    No chief complaint on file.    History of Present Illness:    Ethan Walters is a 85 y.o. male.  Patient has past medical history of abdominal aortic aneurysm diabetes mellitus and hypertension.  He denies any problems at this time and takes care of  activities of daily living.  He has COPD and is on oxygen.  Has not been seen here for the past several months.  At the time of my evaluation, the patient is alert awake oriented and in no distress.  Past Medical History:  Diagnosis Date  . AAA (abdominal aortic aneurysm) ([place])   . Diabetes mellitus without complication ([place])   . Hypertension     Past Surgical History:  Procedure Laterality Date  . APPENDECTOMY  09/17/2015  . HERNIA REPAIR      Current Medications: Current Meds  Medication Sig  . albuterol (PROAIR HFA) 108 (90 Base) MCG/ACT inhaler   . amLODipine (NORVASC) 10 MG tablet Take 10 mg by mouth daily.   Marland Kitchen aspirin EC 81 MG tablet Take 81 mg by mouth daily.  . bisoprolol (ZEBETA) 10 MG tablet Take by mouth daily.   . hydrALAZINE (APRESOLINE) 50 MG tablet   . simvastatin (ZOCOR) 40 MG tablet Take 1 tablet (40 mg total) by mouth at bedtime.  . torsemide (DEMADEX) 20 MG tablet Take 20 mg by mouth 2 (two) times daily.  . TRADJENTA 5 MG TABS tablet Take 5 mg by mouth at bedtime.      Allergies:   Neosporin [neomycin-bacitracin zn-polymyx]   Social History   Socioeconomic History  . Marital status: Married    Spouse name: Not on file  . Number of children: Not on file  . Years of education: Not on file  .  Highest education level: Not on file  Occupational History  . Not on file  Tobacco Use  . Smoking status: Never Smoker  . Smokeless tobacco: Never Used  Substance and Sexual Activity  . Alcohol use: No    Alcohol/week: 0.0 standard drinks  . Drug use: No  . Sexual activity: Not on file  Other Topics Concern  . Not on file  Social History Narrative  . Not on file   Social Determinants of Health   Financial Resource Strain:   . Difficulty of Paying Living Expenses: Not on file  Food Insecurity:   . Worried About Charity fundraiser in the Last Year: Not on file  . Ran Out of Food in the Last Year: Not on file  Transportation Needs:   . Lack of  Transportation (Medical): Not on file  . Lack of Transportation (Non-Medical): Not on file  Physical Activity:   . Days of Exercise per Week: Not on file  . Minutes of Exercise per Session: Not on file  Stress:   . Feeling of Stress : Not on file  Social Connections:   . Frequency of Communication with Friends and Family: Not on file  . Frequency of Social Gatherings with Friends and Family: Not on file  . Attends Religious Services: Not on file  . Active Member of Clubs or Organizations: Not on file  . Attends Archivist Meetings: Not on file  . Marital Status: Not on file     Family History: The patient's family history is not on file.  ROS:   Please see the history of present illness.    All other systems reviewed and are negative.  EKGs/Labs/Other Studies Reviewed:    The following studies were reviewed today: EKG reveals sinus rhythm and nonspecific ST-T changes.   Recent Labs: No results found for requested labs within last 8760 hours.  Recent Lipid Panel    Component Value Date/Time   CHOL 162 12/04/2017 0925   TRIG 151 (H) 12/04/2017 0925   HDL 33 (L) 12/04/2017 0925   CHOLHDL 4.9 12/04/2017 0925   LDLCALC 99 12/04/2017 0925    Physical Exam:    VS:  BP 132/68   Pulse (!) 54   Ht 5\' 10"  (1.778 m)   Wt 213 lb 12.8 oz (97 kg)   SpO2 98% Comment: 3lt of O2  BMI 30.68 kg/m     Wt Readings from Last 3 Encounters:  05/31/19 213 lb 12.8 oz (97 kg)  11/17/17 212 lb 8 oz (96.4 kg)  09/15/17 214 lb 1.9 oz (97.1 kg)     GEN: Patient is in no acute distress HEENT: Normal NECK: No JVD; No carotid bruits LYMPHATICS: No lymphadenopathy CARDIAC: Hear sounds regular, 2/6 systolic murmur at the apex. RESPIRATORY:  Clear to auscultation without rales, wheezing or rhonchi  ABDOMEN: Soft, non-tender, non-distended MUSCULOSKELETAL:  No edema; No deformity  SKIN: Warm and dry NEUROLOGIC:  Alert and oriented x 3 PSYCHIATRIC:  Normal affect    Signed, Jenean Lindau, MD  05/31/2019 5:01 PM     [facility]

## 2019-05-31 NOTE — Patient Instructions (Signed)
Medication Instructions:  Your physician recommends that you continue on your current medications as directed. Please refer to the Current Medication list given to you today.  *If you need a refill on your cardiac medications before your next appointment, please call your pharmacy*  Lab Work: Your physician recommends that you return for FASTING lab work  On the day of your Abdominal Aorta Duplex Test  If you have labs (blood work) drawn today and your tests are completely normal, you will receive your results only by: Marland Kitchen MyChart Message (if you have MyChart) OR . A paper copy in the mail If you have any lab test that is abnormal or we need to change your treatment, we will call you to review the results.  Testing/Procedures: Your physician has requested that you have an abdominal aorta duplex. During this test, an ultrasound is used to evaluate the aorta. Allow 30 minutes for this exam. Do not eat after midnight the day before and avoid carbonated beverages   Follow-Up: At [facility], you and your health needs are our priority.  As part of our continuing mission to provide you with exceptional heart care, we have created designated Provider Care Teams.  These Care Teams include your primary Cardiologist (physician) and Advanced Practice Providers (APPs -  Physician Assistants and Nurse Practitioners) who all work together to provide you with the care you need, when you need it.  Your next appointment:   6 month(s)  The format for your next appointment:   In Person  Provider:   Dr. Geraldo Pitter  Other Instructions

## 2019-06-03 DIAGNOSIS — J449 Chronic obstructive pulmonary disease, unspecified: Secondary | ICD-10-CM | POA: Diagnosis not present

## 2019-06-03 DIAGNOSIS — N182 Chronic kidney disease, stage 2 (mild): Secondary | ICD-10-CM | POA: Diagnosis not present

## 2019-06-03 DIAGNOSIS — E1151 Type 2 diabetes mellitus with diabetic peripheral angiopathy without gangrene: Secondary | ICD-10-CM | POA: Diagnosis not present

## 2019-06-03 DIAGNOSIS — J9611 Chronic respiratory failure with hypoxia: Secondary | ICD-10-CM | POA: Diagnosis not present

## 2019-06-03 DIAGNOSIS — E11319 Type 2 diabetes mellitus with unspecified diabetic retinopathy without macular edema: Secondary | ICD-10-CM | POA: Diagnosis not present

## 2019-06-03 DIAGNOSIS — E11621 Type 2 diabetes mellitus with foot ulcer: Secondary | ICD-10-CM | POA: Diagnosis not present

## 2019-06-03 DIAGNOSIS — E1122 Type 2 diabetes mellitus with diabetic chronic kidney disease: Secondary | ICD-10-CM | POA: Diagnosis not present

## 2019-06-03 DIAGNOSIS — L97519 Non-pressure chronic ulcer of other part of right foot with unspecified severity: Secondary | ICD-10-CM | POA: Diagnosis not present

## 2019-06-03 DIAGNOSIS — I130 Hypertensive heart and chronic kidney disease with heart failure and stage 1 through stage 4 chronic kidney disease, or unspecified chronic kidney disease: Secondary | ICD-10-CM | POA: Diagnosis not present

## 2019-06-03 DIAGNOSIS — I5030 Unspecified diastolic (congestive) heart failure: Secondary | ICD-10-CM | POA: Diagnosis not present

## 2019-06-05 DIAGNOSIS — J9611 Chronic respiratory failure with hypoxia: Secondary | ICD-10-CM | POA: Diagnosis not present

## 2019-06-05 DIAGNOSIS — I130 Hypertensive heart and chronic kidney disease with heart failure and stage 1 through stage 4 chronic kidney disease, or unspecified chronic kidney disease: Secondary | ICD-10-CM | POA: Diagnosis not present

## 2019-06-05 DIAGNOSIS — E1122 Type 2 diabetes mellitus with diabetic chronic kidney disease: Secondary | ICD-10-CM | POA: Diagnosis not present

## 2019-06-05 DIAGNOSIS — J449 Chronic obstructive pulmonary disease, unspecified: Secondary | ICD-10-CM | POA: Diagnosis not present

## 2019-06-05 DIAGNOSIS — I5030 Unspecified diastolic (congestive) heart failure: Secondary | ICD-10-CM | POA: Diagnosis not present

## 2019-06-05 DIAGNOSIS — E11621 Type 2 diabetes mellitus with foot ulcer: Secondary | ICD-10-CM | POA: Diagnosis not present

## 2019-06-05 DIAGNOSIS — E11319 Type 2 diabetes mellitus with unspecified diabetic retinopathy without macular edema: Secondary | ICD-10-CM | POA: Diagnosis not present

## 2019-06-05 DIAGNOSIS — N182 Chronic kidney disease, stage 2 (mild): Secondary | ICD-10-CM | POA: Diagnosis not present

## 2019-06-05 DIAGNOSIS — L97519 Non-pressure chronic ulcer of other part of right foot with unspecified severity: Secondary | ICD-10-CM | POA: Diagnosis not present

## 2019-06-05 DIAGNOSIS — E1151 Type 2 diabetes mellitus with diabetic peripheral angiopathy without gangrene: Secondary | ICD-10-CM | POA: Diagnosis not present

## 2019-06-11 DIAGNOSIS — N182 Chronic kidney disease, stage 2 (mild): Secondary | ICD-10-CM | POA: Diagnosis not present

## 2019-06-11 DIAGNOSIS — E11319 Type 2 diabetes mellitus with unspecified diabetic retinopathy without macular edema: Secondary | ICD-10-CM | POA: Diagnosis not present

## 2019-06-11 DIAGNOSIS — E1122 Type 2 diabetes mellitus with diabetic chronic kidney disease: Secondary | ICD-10-CM | POA: Diagnosis not present

## 2019-06-11 DIAGNOSIS — Z6831 Body mass index (BMI) 31.0-31.9, adult: Secondary | ICD-10-CM | POA: Diagnosis not present

## 2019-06-11 DIAGNOSIS — E11621 Type 2 diabetes mellitus with foot ulcer: Secondary | ICD-10-CM | POA: Diagnosis not present

## 2019-06-11 DIAGNOSIS — E1151 Type 2 diabetes mellitus with diabetic peripheral angiopathy without gangrene: Secondary | ICD-10-CM | POA: Diagnosis not present

## 2019-06-11 DIAGNOSIS — J9611 Chronic respiratory failure with hypoxia: Secondary | ICD-10-CM | POA: Diagnosis not present

## 2019-06-11 DIAGNOSIS — L0390 Cellulitis, unspecified: Secondary | ICD-10-CM | POA: Diagnosis not present

## 2019-06-11 DIAGNOSIS — I130 Hypertensive heart and chronic kidney disease with heart failure and stage 1 through stage 4 chronic kidney disease, or unspecified chronic kidney disease: Secondary | ICD-10-CM | POA: Diagnosis not present

## 2019-06-11 DIAGNOSIS — J449 Chronic obstructive pulmonary disease, unspecified: Secondary | ICD-10-CM | POA: Diagnosis not present

## 2019-06-11 DIAGNOSIS — L97519 Non-pressure chronic ulcer of other part of right foot with unspecified severity: Secondary | ICD-10-CM | POA: Diagnosis not present

## 2019-06-11 DIAGNOSIS — I5030 Unspecified diastolic (congestive) heart failure: Secondary | ICD-10-CM | POA: Diagnosis not present

## 2019-06-11 DIAGNOSIS — E119 Type 2 diabetes mellitus without complications: Secondary | ICD-10-CM | POA: Diagnosis not present

## 2019-06-11 DIAGNOSIS — L600 Ingrowing nail: Secondary | ICD-10-CM | POA: Diagnosis not present

## 2019-06-13 DIAGNOSIS — E1122 Type 2 diabetes mellitus with diabetic chronic kidney disease: Secondary | ICD-10-CM | POA: Diagnosis not present

## 2019-06-13 DIAGNOSIS — L97519 Non-pressure chronic ulcer of other part of right foot with unspecified severity: Secondary | ICD-10-CM | POA: Diagnosis not present

## 2019-06-13 DIAGNOSIS — L0390 Cellulitis, unspecified: Secondary | ICD-10-CM | POA: Diagnosis not present

## 2019-06-13 DIAGNOSIS — E11319 Type 2 diabetes mellitus with unspecified diabetic retinopathy without macular edema: Secondary | ICD-10-CM | POA: Diagnosis not present

## 2019-06-13 DIAGNOSIS — J9611 Chronic respiratory failure with hypoxia: Secondary | ICD-10-CM | POA: Diagnosis not present

## 2019-06-13 DIAGNOSIS — I5030 Unspecified diastolic (congestive) heart failure: Secondary | ICD-10-CM | POA: Diagnosis not present

## 2019-06-13 DIAGNOSIS — N182 Chronic kidney disease, stage 2 (mild): Secondary | ICD-10-CM | POA: Diagnosis not present

## 2019-06-13 DIAGNOSIS — E1151 Type 2 diabetes mellitus with diabetic peripheral angiopathy without gangrene: Secondary | ICD-10-CM | POA: Diagnosis not present

## 2019-06-13 DIAGNOSIS — J449 Chronic obstructive pulmonary disease, unspecified: Secondary | ICD-10-CM | POA: Diagnosis not present

## 2019-06-13 DIAGNOSIS — I130 Hypertensive heart and chronic kidney disease with heart failure and stage 1 through stage 4 chronic kidney disease, or unspecified chronic kidney disease: Secondary | ICD-10-CM | POA: Diagnosis not present

## 2019-06-13 DIAGNOSIS — E11621 Type 2 diabetes mellitus with foot ulcer: Secondary | ICD-10-CM | POA: Diagnosis not present

## 2019-06-17 DIAGNOSIS — E11319 Type 2 diabetes mellitus with unspecified diabetic retinopathy without macular edema: Secondary | ICD-10-CM | POA: Diagnosis not present

## 2019-06-17 DIAGNOSIS — L97519 Non-pressure chronic ulcer of other part of right foot with unspecified severity: Secondary | ICD-10-CM | POA: Diagnosis not present

## 2019-06-17 DIAGNOSIS — N183 Chronic kidney disease, stage 3 unspecified: Secondary | ICD-10-CM | POA: Diagnosis not present

## 2019-06-17 DIAGNOSIS — I5030 Unspecified diastolic (congestive) heart failure: Secondary | ICD-10-CM | POA: Diagnosis not present

## 2019-06-17 DIAGNOSIS — E1122 Type 2 diabetes mellitus with diabetic chronic kidney disease: Secondary | ICD-10-CM | POA: Diagnosis not present

## 2019-06-17 DIAGNOSIS — E11621 Type 2 diabetes mellitus with foot ulcer: Secondary | ICD-10-CM | POA: Diagnosis not present

## 2019-06-17 DIAGNOSIS — J441 Chronic obstructive pulmonary disease with (acute) exacerbation: Secondary | ICD-10-CM | POA: Diagnosis not present

## 2019-06-17 DIAGNOSIS — E1151 Type 2 diabetes mellitus with diabetic peripheral angiopathy without gangrene: Secondary | ICD-10-CM | POA: Diagnosis not present

## 2019-06-17 DIAGNOSIS — I130 Hypertensive heart and chronic kidney disease with heart failure and stage 1 through stage 4 chronic kidney disease, or unspecified chronic kidney disease: Secondary | ICD-10-CM | POA: Diagnosis not present

## 2019-06-17 DIAGNOSIS — M109 Gout, unspecified: Secondary | ICD-10-CM | POA: Diagnosis not present

## 2019-06-18 DIAGNOSIS — I87313 Chronic venous hypertension (idiopathic) with ulcer of bilateral lower extremity: Secondary | ICD-10-CM | POA: Diagnosis not present

## 2019-06-18 DIAGNOSIS — L97812 Non-pressure chronic ulcer of other part of right lower leg with fat layer exposed: Secondary | ICD-10-CM | POA: Diagnosis not present

## 2019-06-18 DIAGNOSIS — E11622 Type 2 diabetes mellitus with other skin ulcer: Secondary | ICD-10-CM | POA: Diagnosis not present

## 2019-06-18 DIAGNOSIS — L97822 Non-pressure chronic ulcer of other part of left lower leg with fat layer exposed: Secondary | ICD-10-CM | POA: Diagnosis not present

## 2019-06-19 DIAGNOSIS — E11319 Type 2 diabetes mellitus with unspecified diabetic retinopathy without macular edema: Secondary | ICD-10-CM | POA: Diagnosis not present

## 2019-06-19 DIAGNOSIS — I5030 Unspecified diastolic (congestive) heart failure: Secondary | ICD-10-CM | POA: Diagnosis not present

## 2019-06-19 DIAGNOSIS — N183 Chronic kidney disease, stage 3 unspecified: Secondary | ICD-10-CM | POA: Diagnosis not present

## 2019-06-19 DIAGNOSIS — I130 Hypertensive heart and chronic kidney disease with heart failure and stage 1 through stage 4 chronic kidney disease, or unspecified chronic kidney disease: Secondary | ICD-10-CM | POA: Diagnosis not present

## 2019-06-19 DIAGNOSIS — E11621 Type 2 diabetes mellitus with foot ulcer: Secondary | ICD-10-CM | POA: Diagnosis not present

## 2019-06-19 DIAGNOSIS — M109 Gout, unspecified: Secondary | ICD-10-CM | POA: Diagnosis not present

## 2019-06-19 DIAGNOSIS — E1122 Type 2 diabetes mellitus with diabetic chronic kidney disease: Secondary | ICD-10-CM | POA: Diagnosis not present

## 2019-06-19 DIAGNOSIS — J441 Chronic obstructive pulmonary disease with (acute) exacerbation: Secondary | ICD-10-CM | POA: Diagnosis not present

## 2019-06-19 DIAGNOSIS — L97519 Non-pressure chronic ulcer of other part of right foot with unspecified severity: Secondary | ICD-10-CM | POA: Diagnosis not present

## 2019-06-19 DIAGNOSIS — E1151 Type 2 diabetes mellitus with diabetic peripheral angiopathy without gangrene: Secondary | ICD-10-CM | POA: Diagnosis not present

## 2019-06-21 DIAGNOSIS — L97519 Non-pressure chronic ulcer of other part of right foot with unspecified severity: Secondary | ICD-10-CM | POA: Diagnosis not present

## 2019-06-21 DIAGNOSIS — E1151 Type 2 diabetes mellitus with diabetic peripheral angiopathy without gangrene: Secondary | ICD-10-CM | POA: Diagnosis not present

## 2019-06-21 DIAGNOSIS — E1122 Type 2 diabetes mellitus with diabetic chronic kidney disease: Secondary | ICD-10-CM | POA: Diagnosis not present

## 2019-06-21 DIAGNOSIS — J441 Chronic obstructive pulmonary disease with (acute) exacerbation: Secondary | ICD-10-CM | POA: Diagnosis not present

## 2019-06-21 DIAGNOSIS — E11621 Type 2 diabetes mellitus with foot ulcer: Secondary | ICD-10-CM | POA: Diagnosis not present

## 2019-06-21 DIAGNOSIS — M109 Gout, unspecified: Secondary | ICD-10-CM | POA: Diagnosis not present

## 2019-06-21 DIAGNOSIS — N183 Chronic kidney disease, stage 3 unspecified: Secondary | ICD-10-CM | POA: Diagnosis not present

## 2019-06-21 DIAGNOSIS — E11319 Type 2 diabetes mellitus with unspecified diabetic retinopathy without macular edema: Secondary | ICD-10-CM | POA: Diagnosis not present

## 2019-06-21 DIAGNOSIS — J449 Chronic obstructive pulmonary disease, unspecified: Secondary | ICD-10-CM | POA: Diagnosis not present

## 2019-06-21 DIAGNOSIS — I5030 Unspecified diastolic (congestive) heart failure: Secondary | ICD-10-CM | POA: Diagnosis not present

## 2019-06-21 DIAGNOSIS — I130 Hypertensive heart and chronic kidney disease with heart failure and stage 1 through stage 4 chronic kidney disease, or unspecified chronic kidney disease: Secondary | ICD-10-CM | POA: Diagnosis not present

## 2019-06-24 ENCOUNTER — Other Ambulatory Visit: Payer: Self-pay

## 2019-06-24 ENCOUNTER — Ambulatory Visit: Payer: Medicare HMO | Admitting: Podiatry

## 2019-06-24 ENCOUNTER — Ambulatory Visit: Payer: Self-pay | Admitting: Podiatry

## 2019-06-24 DIAGNOSIS — L97519 Non-pressure chronic ulcer of other part of right foot with unspecified severity: Secondary | ICD-10-CM | POA: Diagnosis not present

## 2019-06-24 DIAGNOSIS — L600 Ingrowing nail: Secondary | ICD-10-CM

## 2019-06-24 DIAGNOSIS — N183 Chronic kidney disease, stage 3 unspecified: Secondary | ICD-10-CM | POA: Diagnosis not present

## 2019-06-24 DIAGNOSIS — E1122 Type 2 diabetes mellitus with diabetic chronic kidney disease: Secondary | ICD-10-CM | POA: Diagnosis not present

## 2019-06-24 DIAGNOSIS — B351 Tinea unguium: Secondary | ICD-10-CM | POA: Diagnosis not present

## 2019-06-24 DIAGNOSIS — M79676 Pain in unspecified toe(s): Secondary | ICD-10-CM

## 2019-06-24 DIAGNOSIS — I5030 Unspecified diastolic (congestive) heart failure: Secondary | ICD-10-CM | POA: Diagnosis not present

## 2019-06-24 DIAGNOSIS — J441 Chronic obstructive pulmonary disease with (acute) exacerbation: Secondary | ICD-10-CM | POA: Diagnosis not present

## 2019-06-24 DIAGNOSIS — E1151 Type 2 diabetes mellitus with diabetic peripheral angiopathy without gangrene: Secondary | ICD-10-CM | POA: Diagnosis not present

## 2019-06-24 DIAGNOSIS — I130 Hypertensive heart and chronic kidney disease with heart failure and stage 1 through stage 4 chronic kidney disease, or unspecified chronic kidney disease: Secondary | ICD-10-CM | POA: Diagnosis not present

## 2019-06-24 DIAGNOSIS — E11319 Type 2 diabetes mellitus with unspecified diabetic retinopathy without macular edema: Secondary | ICD-10-CM | POA: Diagnosis not present

## 2019-06-24 DIAGNOSIS — E11621 Type 2 diabetes mellitus with foot ulcer: Secondary | ICD-10-CM | POA: Diagnosis not present

## 2019-06-24 DIAGNOSIS — M109 Gout, unspecified: Secondary | ICD-10-CM | POA: Diagnosis not present

## 2019-06-24 DIAGNOSIS — E1142 Type 2 diabetes mellitus with diabetic polyneuropathy: Secondary | ICD-10-CM | POA: Diagnosis not present

## 2019-06-24 NOTE — Patient Instructions (Signed)

## 2019-06-24 NOTE — Progress Notes (Signed)
  Subjective:  Patient ID: Ethan Walters, male    DOB: February 26, 1934,  MRN: [id number]  Chief Complaint  Patient presents with  . Nail Problem    Rt hallux toenail pain x few mo; very sore -w/ redness and swellgin -pt denies draiange -pt states he was seen by PCP for same issue and was rx clindamycin Tx: tylenol and clindamycin -pt states he has BL leg ulcers treated at wound center  . Diabetes    FBS: 123 A1c: na PCP: [name] x 2 wks    86 y.o. male presents with the above complaint. History confirmed with patient. Endorses numbness to his feet  Objective:  Physical Exam: warm, good capillary refill, normal sensory exam and unable to palpate DP/PT pulses 2/2 dressing; feet warm; Bilateral leg wounds with intact mulitilayer compresison dressing. Nails with thickening dystriphy. Right hallux ingrown medial and lateral borders  No images are attached to the encounter. Assessment:   1. DM type 2 with diabetic peripheral neuropathy ([place])   2. Onychomycosis   3. Ingrown nail   4. Pain around toenail     Plan:  Patient was evaluated and treated and all questions answered.  Onychomycosis, Ingrown Nail and DPN -Patient elects to proceed with ingrown toenail removal today -Ingrown nail avulsed. See procedure note. -Patient is diabetic with a qualifying condition for at risk foot care. -Patient to follow up in 2 weeks for nail check.  Procedure: Excision of Ingrown Toenail Location: Right 1st toe both nail borders. Anesthesia: Lidocaine 1% plain; 1.60mL and Marcaine 0.5% plain; 1.10mL, digital block. Skin Prep: Betadine. Dressing: Silvadene; telfa; dry, sterile, compression dressing. Technique: Following skin prep, a tourniquet was secured at the base of the toe. The affected nail borders were freed, split with a nail splitter, and excised. Sterile dressing applied. Disposition: Patient tolerated procedure well. Patient to return in 2 weeks for follow-up.  Procedure: Nail  Debridement Rationale: Patient meets criteria for routine foot care due to DPN Type of Debridement: manual, sharp debridement. Instrumentation: Nail nipper, rotary burr. Number of Nails: 9  Return in about 2 weeks (around 07/08/2019) for Nail Check.   MDM

## 2019-06-25 DIAGNOSIS — I5030 Unspecified diastolic (congestive) heart failure: Secondary | ICD-10-CM | POA: Diagnosis not present

## 2019-06-25 DIAGNOSIS — E1122 Type 2 diabetes mellitus with diabetic chronic kidney disease: Secondary | ICD-10-CM | POA: Diagnosis not present

## 2019-06-25 DIAGNOSIS — I1310 Hypertensive heart and chronic kidney disease without heart failure, with stage 1 through stage 4 chronic kidney disease, or unspecified chronic kidney disease: Secondary | ICD-10-CM | POA: Diagnosis not present

## 2019-06-25 DIAGNOSIS — E11622 Type 2 diabetes mellitus with other skin ulcer: Secondary | ICD-10-CM | POA: Diagnosis not present

## 2019-06-25 DIAGNOSIS — I714 Abdominal aortic aneurysm, without rupture: Secondary | ICD-10-CM | POA: Diagnosis not present

## 2019-06-25 DIAGNOSIS — R413 Other amnesia: Secondary | ICD-10-CM | POA: Diagnosis not present

## 2019-06-25 DIAGNOSIS — L97829 Non-pressure chronic ulcer of other part of left lower leg with unspecified severity: Secondary | ICD-10-CM | POA: Diagnosis not present

## 2019-06-25 DIAGNOSIS — E1165 Type 2 diabetes mellitus with hyperglycemia: Secondary | ICD-10-CM | POA: Diagnosis not present

## 2019-06-25 DIAGNOSIS — E785 Hyperlipidemia, unspecified: Secondary | ICD-10-CM | POA: Diagnosis not present

## 2019-06-25 DIAGNOSIS — I87303 Chronic venous hypertension (idiopathic) without complications of bilateral lower extremity: Secondary | ICD-10-CM | POA: Diagnosis not present

## 2019-06-25 DIAGNOSIS — E119 Type 2 diabetes mellitus without complications: Secondary | ICD-10-CM | POA: Diagnosis not present

## 2019-06-25 DIAGNOSIS — M109 Gout, unspecified: Secondary | ICD-10-CM | POA: Diagnosis not present

## 2019-06-25 DIAGNOSIS — I87312 Chronic venous hypertension (idiopathic) with ulcer of left lower extremity: Secondary | ICD-10-CM | POA: Diagnosis not present

## 2019-06-25 DIAGNOSIS — J449 Chronic obstructive pulmonary disease, unspecified: Secondary | ICD-10-CM | POA: Diagnosis not present

## 2019-06-25 DIAGNOSIS — J9611 Chronic respiratory failure with hypoxia: Secondary | ICD-10-CM | POA: Diagnosis not present

## 2019-06-25 DIAGNOSIS — N182 Chronic kidney disease, stage 2 (mild): Secondary | ICD-10-CM | POA: Diagnosis not present

## 2019-06-25 DIAGNOSIS — Z79899 Other long term (current) drug therapy: Secondary | ICD-10-CM | POA: Diagnosis not present

## 2019-06-25 DIAGNOSIS — Z8631 Personal history of diabetic foot ulcer: Secondary | ICD-10-CM | POA: Diagnosis not present

## 2019-06-25 DIAGNOSIS — N390 Urinary tract infection, site not specified: Secondary | ICD-10-CM | POA: Diagnosis not present

## 2019-06-28 DIAGNOSIS — M109 Gout, unspecified: Secondary | ICD-10-CM | POA: Diagnosis not present

## 2019-06-28 DIAGNOSIS — L97519 Non-pressure chronic ulcer of other part of right foot with unspecified severity: Secondary | ICD-10-CM | POA: Diagnosis not present

## 2019-06-28 DIAGNOSIS — E11621 Type 2 diabetes mellitus with foot ulcer: Secondary | ICD-10-CM | POA: Diagnosis not present

## 2019-06-28 DIAGNOSIS — Z20822 Contact with and (suspected) exposure to covid-19: Secondary | ICD-10-CM | POA: Diagnosis not present

## 2019-06-28 DIAGNOSIS — N179 Acute kidney failure, unspecified: Secondary | ICD-10-CM | POA: Diagnosis not present

## 2019-06-28 DIAGNOSIS — I10 Essential (primary) hypertension: Secondary | ICD-10-CM | POA: Diagnosis not present

## 2019-06-28 DIAGNOSIS — N183 Chronic kidney disease, stage 3 unspecified: Secondary | ICD-10-CM | POA: Diagnosis not present

## 2019-06-28 DIAGNOSIS — I5030 Unspecified diastolic (congestive) heart failure: Secondary | ICD-10-CM | POA: Diagnosis not present

## 2019-06-28 DIAGNOSIS — J441 Chronic obstructive pulmonary disease with (acute) exacerbation: Secondary | ICD-10-CM | POA: Diagnosis not present

## 2019-06-28 DIAGNOSIS — E1122 Type 2 diabetes mellitus with diabetic chronic kidney disease: Secondary | ICD-10-CM | POA: Diagnosis not present

## 2019-06-28 DIAGNOSIS — J189 Pneumonia, unspecified organism: Secondary | ICD-10-CM | POA: Diagnosis not present

## 2019-06-28 DIAGNOSIS — J9621 Acute and chronic respiratory failure with hypoxia: Secondary | ICD-10-CM | POA: Diagnosis not present

## 2019-06-28 DIAGNOSIS — R531 Weakness: Secondary | ICD-10-CM | POA: Diagnosis not present

## 2019-06-28 DIAGNOSIS — I951 Orthostatic hypotension: Secondary | ICD-10-CM | POA: Diagnosis not present

## 2019-06-28 DIAGNOSIS — J208 Acute bronchitis due to other specified organisms: Secondary | ICD-10-CM | POA: Diagnosis not present

## 2019-06-28 DIAGNOSIS — J440 Chronic obstructive pulmonary disease with acute lower respiratory infection: Secondary | ICD-10-CM | POA: Diagnosis not present

## 2019-06-28 DIAGNOSIS — E1151 Type 2 diabetes mellitus with diabetic peripheral angiopathy without gangrene: Secondary | ICD-10-CM | POA: Diagnosis not present

## 2019-06-28 DIAGNOSIS — R739 Hyperglycemia, unspecified: Secondary | ICD-10-CM | POA: Diagnosis not present

## 2019-06-28 DIAGNOSIS — I5032 Chronic diastolic (congestive) heart failure: Secondary | ICD-10-CM | POA: Diagnosis not present

## 2019-06-28 DIAGNOSIS — I130 Hypertensive heart and chronic kidney disease with heart failure and stage 1 through stage 4 chronic kidney disease, or unspecified chronic kidney disease: Secondary | ICD-10-CM | POA: Diagnosis not present

## 2019-06-28 DIAGNOSIS — R0602 Shortness of breath: Secondary | ICD-10-CM | POA: Diagnosis not present

## 2019-06-28 DIAGNOSIS — N189 Chronic kidney disease, unspecified: Secondary | ICD-10-CM | POA: Diagnosis not present

## 2019-06-28 DIAGNOSIS — E785 Hyperlipidemia, unspecified: Secondary | ICD-10-CM | POA: Diagnosis not present

## 2019-06-28 DIAGNOSIS — E11319 Type 2 diabetes mellitus with unspecified diabetic retinopathy without macular edema: Secondary | ICD-10-CM | POA: Diagnosis not present

## 2019-06-29 DIAGNOSIS — I340 Nonrheumatic mitral (valve) insufficiency: Secondary | ICD-10-CM | POA: Diagnosis not present

## 2019-06-29 DIAGNOSIS — N189 Chronic kidney disease, unspecified: Secondary | ICD-10-CM | POA: Diagnosis not present

## 2019-06-29 DIAGNOSIS — R739 Hyperglycemia, unspecified: Secondary | ICD-10-CM | POA: Diagnosis not present

## 2019-06-29 DIAGNOSIS — I10 Essential (primary) hypertension: Secondary | ICD-10-CM | POA: Diagnosis not present

## 2019-06-29 DIAGNOSIS — J189 Pneumonia, unspecified organism: Secondary | ICD-10-CM | POA: Diagnosis not present

## 2019-06-29 DIAGNOSIS — J441 Chronic obstructive pulmonary disease with (acute) exacerbation: Secondary | ICD-10-CM | POA: Diagnosis not present

## 2019-06-30 DIAGNOSIS — N189 Chronic kidney disease, unspecified: Secondary | ICD-10-CM | POA: Diagnosis not present

## 2019-06-30 DIAGNOSIS — R739 Hyperglycemia, unspecified: Secondary | ICD-10-CM | POA: Diagnosis not present

## 2019-06-30 DIAGNOSIS — J441 Chronic obstructive pulmonary disease with (acute) exacerbation: Secondary | ICD-10-CM | POA: Diagnosis not present

## 2019-06-30 DIAGNOSIS — I10 Essential (primary) hypertension: Secondary | ICD-10-CM | POA: Diagnosis not present

## 2019-06-30 DIAGNOSIS — J189 Pneumonia, unspecified organism: Secondary | ICD-10-CM | POA: Diagnosis not present

## 2019-07-01 DIAGNOSIS — N183 Chronic kidney disease, stage 3 unspecified: Secondary | ICD-10-CM | POA: Diagnosis not present

## 2019-07-01 DIAGNOSIS — E1151 Type 2 diabetes mellitus with diabetic peripheral angiopathy without gangrene: Secondary | ICD-10-CM | POA: Diagnosis not present

## 2019-07-01 DIAGNOSIS — L97519 Non-pressure chronic ulcer of other part of right foot with unspecified severity: Secondary | ICD-10-CM | POA: Diagnosis not present

## 2019-07-01 DIAGNOSIS — I130 Hypertensive heart and chronic kidney disease with heart failure and stage 1 through stage 4 chronic kidney disease, or unspecified chronic kidney disease: Secondary | ICD-10-CM | POA: Diagnosis not present

## 2019-07-01 DIAGNOSIS — J441 Chronic obstructive pulmonary disease with (acute) exacerbation: Secondary | ICD-10-CM | POA: Diagnosis not present

## 2019-07-01 DIAGNOSIS — M109 Gout, unspecified: Secondary | ICD-10-CM | POA: Diagnosis not present

## 2019-07-01 DIAGNOSIS — E11319 Type 2 diabetes mellitus with unspecified diabetic retinopathy without macular edema: Secondary | ICD-10-CM | POA: Diagnosis not present

## 2019-07-01 DIAGNOSIS — E1122 Type 2 diabetes mellitus with diabetic chronic kidney disease: Secondary | ICD-10-CM | POA: Diagnosis not present

## 2019-07-01 DIAGNOSIS — I5030 Unspecified diastolic (congestive) heart failure: Secondary | ICD-10-CM | POA: Diagnosis not present

## 2019-07-01 DIAGNOSIS — E11621 Type 2 diabetes mellitus with foot ulcer: Secondary | ICD-10-CM | POA: Diagnosis not present

## 2019-07-02 DIAGNOSIS — E11621 Type 2 diabetes mellitus with foot ulcer: Secondary | ICD-10-CM | POA: Diagnosis not present

## 2019-07-02 DIAGNOSIS — E11319 Type 2 diabetes mellitus with unspecified diabetic retinopathy without macular edema: Secondary | ICD-10-CM | POA: Diagnosis not present

## 2019-07-02 DIAGNOSIS — J441 Chronic obstructive pulmonary disease with (acute) exacerbation: Secondary | ICD-10-CM | POA: Diagnosis not present

## 2019-07-02 DIAGNOSIS — E1122 Type 2 diabetes mellitus with diabetic chronic kidney disease: Secondary | ICD-10-CM | POA: Diagnosis not present

## 2019-07-02 DIAGNOSIS — I130 Hypertensive heart and chronic kidney disease with heart failure and stage 1 through stage 4 chronic kidney disease, or unspecified chronic kidney disease: Secondary | ICD-10-CM | POA: Diagnosis not present

## 2019-07-02 DIAGNOSIS — N183 Chronic kidney disease, stage 3 unspecified: Secondary | ICD-10-CM | POA: Diagnosis not present

## 2019-07-02 DIAGNOSIS — E1151 Type 2 diabetes mellitus with diabetic peripheral angiopathy without gangrene: Secondary | ICD-10-CM | POA: Diagnosis not present

## 2019-07-02 DIAGNOSIS — L97519 Non-pressure chronic ulcer of other part of right foot with unspecified severity: Secondary | ICD-10-CM | POA: Diagnosis not present

## 2019-07-02 DIAGNOSIS — M109 Gout, unspecified: Secondary | ICD-10-CM | POA: Diagnosis not present

## 2019-07-02 DIAGNOSIS — I5030 Unspecified diastolic (congestive) heart failure: Secondary | ICD-10-CM | POA: Diagnosis not present

## 2019-07-04 DIAGNOSIS — E1122 Type 2 diabetes mellitus with diabetic chronic kidney disease: Secondary | ICD-10-CM | POA: Diagnosis not present

## 2019-07-04 DIAGNOSIS — L97519 Non-pressure chronic ulcer of other part of right foot with unspecified severity: Secondary | ICD-10-CM | POA: Diagnosis not present

## 2019-07-04 DIAGNOSIS — I130 Hypertensive heart and chronic kidney disease with heart failure and stage 1 through stage 4 chronic kidney disease, or unspecified chronic kidney disease: Secondary | ICD-10-CM | POA: Diagnosis not present

## 2019-07-04 DIAGNOSIS — E11621 Type 2 diabetes mellitus with foot ulcer: Secondary | ICD-10-CM | POA: Diagnosis not present

## 2019-07-04 DIAGNOSIS — N183 Chronic kidney disease, stage 3 unspecified: Secondary | ICD-10-CM | POA: Diagnosis not present

## 2019-07-04 DIAGNOSIS — I5030 Unspecified diastolic (congestive) heart failure: Secondary | ICD-10-CM | POA: Diagnosis not present

## 2019-07-04 DIAGNOSIS — E11319 Type 2 diabetes mellitus with unspecified diabetic retinopathy without macular edema: Secondary | ICD-10-CM | POA: Diagnosis not present

## 2019-07-04 DIAGNOSIS — J441 Chronic obstructive pulmonary disease with (acute) exacerbation: Secondary | ICD-10-CM | POA: Diagnosis not present

## 2019-07-04 DIAGNOSIS — M109 Gout, unspecified: Secondary | ICD-10-CM | POA: Diagnosis not present

## 2019-07-04 DIAGNOSIS — E1151 Type 2 diabetes mellitus with diabetic peripheral angiopathy without gangrene: Secondary | ICD-10-CM | POA: Diagnosis not present

## 2019-07-05 DIAGNOSIS — E1151 Type 2 diabetes mellitus with diabetic peripheral angiopathy without gangrene: Secondary | ICD-10-CM | POA: Diagnosis not present

## 2019-07-05 DIAGNOSIS — E11319 Type 2 diabetes mellitus with unspecified diabetic retinopathy without macular edema: Secondary | ICD-10-CM | POA: Diagnosis not present

## 2019-07-05 DIAGNOSIS — M109 Gout, unspecified: Secondary | ICD-10-CM | POA: Diagnosis not present

## 2019-07-05 DIAGNOSIS — Z7689 Persons encountering health services in other specified circumstances: Secondary | ICD-10-CM | POA: Diagnosis not present

## 2019-07-05 DIAGNOSIS — E1122 Type 2 diabetes mellitus with diabetic chronic kidney disease: Secondary | ICD-10-CM | POA: Diagnosis not present

## 2019-07-05 DIAGNOSIS — J441 Chronic obstructive pulmonary disease with (acute) exacerbation: Secondary | ICD-10-CM | POA: Diagnosis not present

## 2019-07-05 DIAGNOSIS — L0390 Cellulitis, unspecified: Secondary | ICD-10-CM | POA: Diagnosis not present

## 2019-07-05 DIAGNOSIS — I5030 Unspecified diastolic (congestive) heart failure: Secondary | ICD-10-CM | POA: Diagnosis not present

## 2019-07-05 DIAGNOSIS — I130 Hypertensive heart and chronic kidney disease with heart failure and stage 1 through stage 4 chronic kidney disease, or unspecified chronic kidney disease: Secondary | ICD-10-CM | POA: Diagnosis not present

## 2019-07-05 DIAGNOSIS — E11621 Type 2 diabetes mellitus with foot ulcer: Secondary | ICD-10-CM | POA: Diagnosis not present

## 2019-07-05 DIAGNOSIS — L97519 Non-pressure chronic ulcer of other part of right foot with unspecified severity: Secondary | ICD-10-CM | POA: Diagnosis not present

## 2019-07-05 DIAGNOSIS — N183 Chronic kidney disease, stage 3 unspecified: Secondary | ICD-10-CM | POA: Diagnosis not present

## 2019-07-08 DIAGNOSIS — I872 Venous insufficiency (chronic) (peripheral): Secondary | ICD-10-CM | POA: Diagnosis not present

## 2019-07-08 DIAGNOSIS — E119 Type 2 diabetes mellitus without complications: Secondary | ICD-10-CM | POA: Diagnosis not present

## 2019-07-08 DIAGNOSIS — Z8631 Personal history of diabetic foot ulcer: Secondary | ICD-10-CM | POA: Diagnosis not present

## 2019-07-09 ENCOUNTER — Ambulatory Visit: Payer: Medicare HMO | Admitting: Podiatry

## 2019-07-09 DIAGNOSIS — E1122 Type 2 diabetes mellitus with diabetic chronic kidney disease: Secondary | ICD-10-CM | POA: Diagnosis not present

## 2019-07-09 DIAGNOSIS — N183 Chronic kidney disease, stage 3 unspecified: Secondary | ICD-10-CM | POA: Diagnosis not present

## 2019-07-09 DIAGNOSIS — I5030 Unspecified diastolic (congestive) heart failure: Secondary | ICD-10-CM | POA: Diagnosis not present

## 2019-07-09 DIAGNOSIS — J441 Chronic obstructive pulmonary disease with (acute) exacerbation: Secondary | ICD-10-CM | POA: Diagnosis not present

## 2019-07-09 DIAGNOSIS — L97519 Non-pressure chronic ulcer of other part of right foot with unspecified severity: Secondary | ICD-10-CM | POA: Diagnosis not present

## 2019-07-09 DIAGNOSIS — E11621 Type 2 diabetes mellitus with foot ulcer: Secondary | ICD-10-CM | POA: Diagnosis not present

## 2019-07-09 DIAGNOSIS — M109 Gout, unspecified: Secondary | ICD-10-CM | POA: Diagnosis not present

## 2019-07-09 DIAGNOSIS — I130 Hypertensive heart and chronic kidney disease with heart failure and stage 1 through stage 4 chronic kidney disease, or unspecified chronic kidney disease: Secondary | ICD-10-CM | POA: Diagnosis not present

## 2019-07-09 DIAGNOSIS — E11319 Type 2 diabetes mellitus with unspecified diabetic retinopathy without macular edema: Secondary | ICD-10-CM | POA: Diagnosis not present

## 2019-07-09 DIAGNOSIS — E1151 Type 2 diabetes mellitus with diabetic peripheral angiopathy without gangrene: Secondary | ICD-10-CM | POA: Diagnosis not present

## 2019-07-10 DIAGNOSIS — I5030 Unspecified diastolic (congestive) heart failure: Secondary | ICD-10-CM | POA: Diagnosis not present

## 2019-07-10 DIAGNOSIS — E11621 Type 2 diabetes mellitus with foot ulcer: Secondary | ICD-10-CM | POA: Diagnosis not present

## 2019-07-10 DIAGNOSIS — E1122 Type 2 diabetes mellitus with diabetic chronic kidney disease: Secondary | ICD-10-CM | POA: Diagnosis not present

## 2019-07-10 DIAGNOSIS — I130 Hypertensive heart and chronic kidney disease with heart failure and stage 1 through stage 4 chronic kidney disease, or unspecified chronic kidney disease: Secondary | ICD-10-CM | POA: Diagnosis not present

## 2019-07-10 DIAGNOSIS — E11319 Type 2 diabetes mellitus with unspecified diabetic retinopathy without macular edema: Secondary | ICD-10-CM | POA: Diagnosis not present

## 2019-07-10 DIAGNOSIS — M109 Gout, unspecified: Secondary | ICD-10-CM | POA: Diagnosis not present

## 2019-07-10 DIAGNOSIS — N183 Chronic kidney disease, stage 3 unspecified: Secondary | ICD-10-CM | POA: Diagnosis not present

## 2019-07-10 DIAGNOSIS — J441 Chronic obstructive pulmonary disease with (acute) exacerbation: Secondary | ICD-10-CM | POA: Diagnosis not present

## 2019-07-10 DIAGNOSIS — E1151 Type 2 diabetes mellitus with diabetic peripheral angiopathy without gangrene: Secondary | ICD-10-CM | POA: Diagnosis not present

## 2019-07-10 DIAGNOSIS — L97519 Non-pressure chronic ulcer of other part of right foot with unspecified severity: Secondary | ICD-10-CM | POA: Diagnosis not present

## 2019-07-11 DIAGNOSIS — I509 Heart failure, unspecified: Secondary | ICD-10-CM | POA: Diagnosis not present

## 2019-07-11 DIAGNOSIS — E11620 Type 2 diabetes mellitus with diabetic dermatitis: Secondary | ICD-10-CM | POA: Diagnosis not present

## 2019-07-11 DIAGNOSIS — E785 Hyperlipidemia, unspecified: Secondary | ICD-10-CM | POA: Diagnosis not present

## 2019-07-11 DIAGNOSIS — E1151 Type 2 diabetes mellitus with diabetic peripheral angiopathy without gangrene: Secondary | ICD-10-CM | POA: Diagnosis not present

## 2019-07-11 DIAGNOSIS — Z794 Long term (current) use of insulin: Secondary | ICD-10-CM | POA: Diagnosis not present

## 2019-07-11 DIAGNOSIS — J9611 Chronic respiratory failure with hypoxia: Secondary | ICD-10-CM | POA: Diagnosis not present

## 2019-07-11 DIAGNOSIS — I110 Hypertensive heart disease with heart failure: Secondary | ICD-10-CM | POA: Diagnosis not present

## 2019-07-11 DIAGNOSIS — E261 Secondary hyperaldosteronism: Secondary | ICD-10-CM | POA: Diagnosis not present

## 2019-07-11 DIAGNOSIS — Z008 Encounter for other general examination: Secondary | ICD-10-CM | POA: Diagnosis not present

## 2019-07-11 DIAGNOSIS — J449 Chronic obstructive pulmonary disease, unspecified: Secondary | ICD-10-CM | POA: Diagnosis not present

## 2019-07-11 DIAGNOSIS — E663 Overweight: Secondary | ICD-10-CM | POA: Diagnosis not present

## 2019-07-12 DIAGNOSIS — I5030 Unspecified diastolic (congestive) heart failure: Secondary | ICD-10-CM | POA: Diagnosis not present

## 2019-07-12 DIAGNOSIS — J441 Chronic obstructive pulmonary disease with (acute) exacerbation: Secondary | ICD-10-CM | POA: Diagnosis not present

## 2019-07-12 DIAGNOSIS — I130 Hypertensive heart and chronic kidney disease with heart failure and stage 1 through stage 4 chronic kidney disease, or unspecified chronic kidney disease: Secondary | ICD-10-CM | POA: Diagnosis not present

## 2019-07-12 DIAGNOSIS — M109 Gout, unspecified: Secondary | ICD-10-CM | POA: Diagnosis not present

## 2019-07-12 DIAGNOSIS — L97519 Non-pressure chronic ulcer of other part of right foot with unspecified severity: Secondary | ICD-10-CM | POA: Diagnosis not present

## 2019-07-12 DIAGNOSIS — E1151 Type 2 diabetes mellitus with diabetic peripheral angiopathy without gangrene: Secondary | ICD-10-CM | POA: Diagnosis not present

## 2019-07-12 DIAGNOSIS — E11621 Type 2 diabetes mellitus with foot ulcer: Secondary | ICD-10-CM | POA: Diagnosis not present

## 2019-07-12 DIAGNOSIS — E1122 Type 2 diabetes mellitus with diabetic chronic kidney disease: Secondary | ICD-10-CM | POA: Diagnosis not present

## 2019-07-12 DIAGNOSIS — N183 Chronic kidney disease, stage 3 unspecified: Secondary | ICD-10-CM | POA: Diagnosis not present

## 2019-07-12 DIAGNOSIS — E11319 Type 2 diabetes mellitus with unspecified diabetic retinopathy without macular edema: Secondary | ICD-10-CM | POA: Diagnosis not present

## 2019-07-15 DIAGNOSIS — E1151 Type 2 diabetes mellitus with diabetic peripheral angiopathy without gangrene: Secondary | ICD-10-CM | POA: Diagnosis not present

## 2019-07-15 DIAGNOSIS — J441 Chronic obstructive pulmonary disease with (acute) exacerbation: Secondary | ICD-10-CM | POA: Diagnosis not present

## 2019-07-15 DIAGNOSIS — E1122 Type 2 diabetes mellitus with diabetic chronic kidney disease: Secondary | ICD-10-CM | POA: Diagnosis not present

## 2019-07-15 DIAGNOSIS — N183 Chronic kidney disease, stage 3 unspecified: Secondary | ICD-10-CM | POA: Diagnosis not present

## 2019-07-15 DIAGNOSIS — E11621 Type 2 diabetes mellitus with foot ulcer: Secondary | ICD-10-CM | POA: Diagnosis not present

## 2019-07-15 DIAGNOSIS — M109 Gout, unspecified: Secondary | ICD-10-CM | POA: Diagnosis not present

## 2019-07-15 DIAGNOSIS — L97519 Non-pressure chronic ulcer of other part of right foot with unspecified severity: Secondary | ICD-10-CM | POA: Diagnosis not present

## 2019-07-15 DIAGNOSIS — I130 Hypertensive heart and chronic kidney disease with heart failure and stage 1 through stage 4 chronic kidney disease, or unspecified chronic kidney disease: Secondary | ICD-10-CM | POA: Diagnosis not present

## 2019-07-15 DIAGNOSIS — E11319 Type 2 diabetes mellitus with unspecified diabetic retinopathy without macular edema: Secondary | ICD-10-CM | POA: Diagnosis not present

## 2019-07-15 DIAGNOSIS — I5030 Unspecified diastolic (congestive) heart failure: Secondary | ICD-10-CM | POA: Diagnosis not present

## 2019-07-16 DIAGNOSIS — N183 Chronic kidney disease, stage 3 unspecified: Secondary | ICD-10-CM | POA: Diagnosis not present

## 2019-07-16 DIAGNOSIS — E1151 Type 2 diabetes mellitus with diabetic peripheral angiopathy without gangrene: Secondary | ICD-10-CM | POA: Diagnosis not present

## 2019-07-16 DIAGNOSIS — E1122 Type 2 diabetes mellitus with diabetic chronic kidney disease: Secondary | ICD-10-CM | POA: Diagnosis not present

## 2019-07-16 DIAGNOSIS — J441 Chronic obstructive pulmonary disease with (acute) exacerbation: Secondary | ICD-10-CM | POA: Diagnosis not present

## 2019-07-16 DIAGNOSIS — E11319 Type 2 diabetes mellitus with unspecified diabetic retinopathy without macular edema: Secondary | ICD-10-CM | POA: Diagnosis not present

## 2019-07-16 DIAGNOSIS — E11621 Type 2 diabetes mellitus with foot ulcer: Secondary | ICD-10-CM | POA: Diagnosis not present

## 2019-07-16 DIAGNOSIS — I5030 Unspecified diastolic (congestive) heart failure: Secondary | ICD-10-CM | POA: Diagnosis not present

## 2019-07-16 DIAGNOSIS — M109 Gout, unspecified: Secondary | ICD-10-CM | POA: Diagnosis not present

## 2019-07-16 DIAGNOSIS — L97519 Non-pressure chronic ulcer of other part of right foot with unspecified severity: Secondary | ICD-10-CM | POA: Diagnosis not present

## 2019-07-16 DIAGNOSIS — I130 Hypertensive heart and chronic kidney disease with heart failure and stage 1 through stage 4 chronic kidney disease, or unspecified chronic kidney disease: Secondary | ICD-10-CM | POA: Diagnosis not present

## 2019-07-19 DIAGNOSIS — I130 Hypertensive heart and chronic kidney disease with heart failure and stage 1 through stage 4 chronic kidney disease, or unspecified chronic kidney disease: Secondary | ICD-10-CM | POA: Diagnosis not present

## 2019-07-19 DIAGNOSIS — J441 Chronic obstructive pulmonary disease with (acute) exacerbation: Secondary | ICD-10-CM | POA: Diagnosis not present

## 2019-07-19 DIAGNOSIS — E11621 Type 2 diabetes mellitus with foot ulcer: Secondary | ICD-10-CM | POA: Diagnosis not present

## 2019-07-19 DIAGNOSIS — L97519 Non-pressure chronic ulcer of other part of right foot with unspecified severity: Secondary | ICD-10-CM | POA: Diagnosis not present

## 2019-07-19 DIAGNOSIS — E1122 Type 2 diabetes mellitus with diabetic chronic kidney disease: Secondary | ICD-10-CM | POA: Diagnosis not present

## 2019-07-19 DIAGNOSIS — E1151 Type 2 diabetes mellitus with diabetic peripheral angiopathy without gangrene: Secondary | ICD-10-CM | POA: Diagnosis not present

## 2019-07-19 DIAGNOSIS — M109 Gout, unspecified: Secondary | ICD-10-CM | POA: Diagnosis not present

## 2019-07-19 DIAGNOSIS — I5030 Unspecified diastolic (congestive) heart failure: Secondary | ICD-10-CM | POA: Diagnosis not present

## 2019-07-19 DIAGNOSIS — E11319 Type 2 diabetes mellitus with unspecified diabetic retinopathy without macular edema: Secondary | ICD-10-CM | POA: Diagnosis not present

## 2019-07-19 DIAGNOSIS — N183 Chronic kidney disease, stage 3 unspecified: Secondary | ICD-10-CM | POA: Diagnosis not present

## 2019-07-22 DIAGNOSIS — J441 Chronic obstructive pulmonary disease with (acute) exacerbation: Secondary | ICD-10-CM | POA: Diagnosis not present

## 2019-07-22 DIAGNOSIS — M109 Gout, unspecified: Secondary | ICD-10-CM | POA: Diagnosis not present

## 2019-07-22 DIAGNOSIS — N183 Chronic kidney disease, stage 3 unspecified: Secondary | ICD-10-CM | POA: Diagnosis not present

## 2019-07-22 DIAGNOSIS — E1151 Type 2 diabetes mellitus with diabetic peripheral angiopathy without gangrene: Secondary | ICD-10-CM | POA: Diagnosis not present

## 2019-07-22 DIAGNOSIS — I130 Hypertensive heart and chronic kidney disease with heart failure and stage 1 through stage 4 chronic kidney disease, or unspecified chronic kidney disease: Secondary | ICD-10-CM | POA: Diagnosis not present

## 2019-07-22 DIAGNOSIS — J449 Chronic obstructive pulmonary disease, unspecified: Secondary | ICD-10-CM | POA: Diagnosis not present

## 2019-07-22 DIAGNOSIS — L97519 Non-pressure chronic ulcer of other part of right foot with unspecified severity: Secondary | ICD-10-CM | POA: Diagnosis not present

## 2019-07-22 DIAGNOSIS — E11621 Type 2 diabetes mellitus with foot ulcer: Secondary | ICD-10-CM | POA: Diagnosis not present

## 2019-07-22 DIAGNOSIS — E11319 Type 2 diabetes mellitus with unspecified diabetic retinopathy without macular edema: Secondary | ICD-10-CM | POA: Diagnosis not present

## 2019-07-22 DIAGNOSIS — E1122 Type 2 diabetes mellitus with diabetic chronic kidney disease: Secondary | ICD-10-CM | POA: Diagnosis not present

## 2019-07-22 DIAGNOSIS — I5030 Unspecified diastolic (congestive) heart failure: Secondary | ICD-10-CM | POA: Diagnosis not present

## 2019-07-25 DIAGNOSIS — R230 Cyanosis: Secondary | ICD-10-CM | POA: Diagnosis not present

## 2019-07-25 DIAGNOSIS — R0682 Tachypnea, not elsewhere classified: Secondary | ICD-10-CM | POA: Diagnosis not present

## 2019-07-25 DIAGNOSIS — M79671 Pain in right foot: Secondary | ICD-10-CM | POA: Diagnosis not present

## 2019-07-25 DIAGNOSIS — R140 Abdominal distension (gaseous): Secondary | ICD-10-CM | POA: Diagnosis not present

## 2019-07-25 DIAGNOSIS — M79672 Pain in left foot: Secondary | ICD-10-CM | POA: Diagnosis not present

## 2019-07-25 DIAGNOSIS — R600 Localized edema: Secondary | ICD-10-CM | POA: Diagnosis not present

## 2019-07-25 DIAGNOSIS — M79673 Pain in unspecified foot: Secondary | ICD-10-CM | POA: Diagnosis not present

## 2019-07-25 DIAGNOSIS — R609 Edema, unspecified: Secondary | ICD-10-CM | POA: Diagnosis not present

## 2019-07-31 ENCOUNTER — Other Ambulatory Visit: Payer: Medicare HMO

## 2019-08-13 DIAGNOSIS — M109 Gout, unspecified: Secondary | ICD-10-CM | POA: Diagnosis not present

## 2019-08-13 DIAGNOSIS — Z6830 Body mass index (BMI) 30.0-30.9, adult: Secondary | ICD-10-CM | POA: Diagnosis not present

## 2019-08-13 DIAGNOSIS — R69 Illness, unspecified: Secondary | ICD-10-CM | POA: Diagnosis not present

## 2019-08-13 DIAGNOSIS — G4700 Insomnia, unspecified: Secondary | ICD-10-CM | POA: Diagnosis not present

## 2019-08-19 DIAGNOSIS — J449 Chronic obstructive pulmonary disease, unspecified: Secondary | ICD-10-CM | POA: Diagnosis not present

## 2019-08-23 DIAGNOSIS — Z23 Encounter for immunization: Secondary | ICD-10-CM | POA: Diagnosis not present

## 2019-09-19 DIAGNOSIS — S060X0A Concussion without loss of consciousness, initial encounter: Secondary | ICD-10-CM | POA: Diagnosis not present

## 2019-09-19 DIAGNOSIS — S0003XA Contusion of scalp, initial encounter: Secondary | ICD-10-CM | POA: Diagnosis not present

## 2019-09-19 DIAGNOSIS — I10 Essential (primary) hypertension: Secondary | ICD-10-CM | POA: Diagnosis not present

## 2019-09-19 DIAGNOSIS — R58 Hemorrhage, not elsewhere classified: Secondary | ICD-10-CM | POA: Diagnosis not present

## 2019-09-19 DIAGNOSIS — S0181XA Laceration without foreign body of other part of head, initial encounter: Secondary | ICD-10-CM | POA: Diagnosis not present

## 2019-09-19 DIAGNOSIS — J449 Chronic obstructive pulmonary disease, unspecified: Secondary | ICD-10-CM | POA: Diagnosis not present

## 2019-09-19 DIAGNOSIS — Z23 Encounter for immunization: Secondary | ICD-10-CM | POA: Diagnosis not present

## 2019-09-19 DIAGNOSIS — W19XXXA Unspecified fall, initial encounter: Secondary | ICD-10-CM | POA: Diagnosis not present

## 2019-09-19 DIAGNOSIS — S199XXA Unspecified injury of neck, initial encounter: Secondary | ICD-10-CM | POA: Diagnosis not present

## 2019-09-20 DIAGNOSIS — J449 Chronic obstructive pulmonary disease, unspecified: Secondary | ICD-10-CM | POA: Diagnosis not present

## 2019-09-20 DIAGNOSIS — E039 Hypothyroidism, unspecified: Secondary | ICD-10-CM | POA: Diagnosis not present

## 2019-09-20 DIAGNOSIS — Z23 Encounter for immunization: Secondary | ICD-10-CM | POA: Diagnosis not present

## 2019-09-24 ENCOUNTER — Other Ambulatory Visit: Payer: Self-pay

## 2019-09-24 ENCOUNTER — Ambulatory Visit (INDEPENDENT_AMBULATORY_CARE_PROVIDER_SITE_OTHER): Payer: Medicare HMO

## 2019-09-24 DIAGNOSIS — N183 Chronic kidney disease, stage 3 unspecified: Secondary | ICD-10-CM | POA: Diagnosis not present

## 2019-09-24 DIAGNOSIS — I714 Abdominal aortic aneurysm, without rupture, unspecified: Secondary | ICD-10-CM

## 2019-09-24 DIAGNOSIS — E1122 Type 2 diabetes mellitus with diabetic chronic kidney disease: Secondary | ICD-10-CM | POA: Diagnosis not present

## 2019-09-24 NOTE — Progress Notes (Signed)
Abdominal aortic duplex exam performed, Distal aorta was      5.7 cm dilated.  Jimmy Andrei Mccook RDCS, RVT

## 2019-09-25 DIAGNOSIS — R55 Syncope and collapse: Secondary | ICD-10-CM | POA: Diagnosis not present

## 2019-09-25 DIAGNOSIS — J40 Bronchitis, not specified as acute or chronic: Secondary | ICD-10-CM | POA: Diagnosis not present

## 2019-09-25 DIAGNOSIS — I10 Essential (primary) hypertension: Secondary | ICD-10-CM | POA: Diagnosis not present

## 2019-09-25 DIAGNOSIS — R001 Bradycardia, unspecified: Secondary | ICD-10-CM | POA: Diagnosis not present

## 2019-09-25 DIAGNOSIS — R5381 Other malaise: Secondary | ICD-10-CM | POA: Diagnosis not present

## 2019-09-25 DIAGNOSIS — R4182 Altered mental status, unspecified: Secondary | ICD-10-CM | POA: Diagnosis not present

## 2019-09-25 DIAGNOSIS — R531 Weakness: Secondary | ICD-10-CM | POA: Diagnosis not present

## 2019-09-25 DIAGNOSIS — E860 Dehydration: Secondary | ICD-10-CM | POA: Diagnosis not present

## 2019-09-25 DIAGNOSIS — N390 Urinary tract infection, site not specified: Secondary | ICD-10-CM | POA: Diagnosis not present

## 2019-09-25 DIAGNOSIS — S0990XA Unspecified injury of head, initial encounter: Secondary | ICD-10-CM | POA: Diagnosis not present

## 2019-09-26 DIAGNOSIS — I10 Essential (primary) hypertension: Secondary | ICD-10-CM | POA: Diagnosis not present

## 2019-09-26 DIAGNOSIS — R001 Bradycardia, unspecified: Secondary | ICD-10-CM | POA: Diagnosis not present

## 2019-09-26 DIAGNOSIS — R55 Syncope and collapse: Secondary | ICD-10-CM | POA: Diagnosis not present

## 2019-09-27 DIAGNOSIS — E1122 Type 2 diabetes mellitus with diabetic chronic kidney disease: Secondary | ICD-10-CM | POA: Diagnosis not present

## 2019-09-27 DIAGNOSIS — I10 Essential (primary) hypertension: Secondary | ICD-10-CM | POA: Diagnosis not present

## 2019-09-27 DIAGNOSIS — R531 Weakness: Secondary | ICD-10-CM | POA: Diagnosis not present

## 2019-09-27 DIAGNOSIS — R001 Bradycardia, unspecified: Secondary | ICD-10-CM | POA: Diagnosis not present

## 2019-09-27 DIAGNOSIS — R55 Syncope and collapse: Secondary | ICD-10-CM | POA: Diagnosis not present

## 2019-09-27 DIAGNOSIS — I5032 Chronic diastolic (congestive) heart failure: Secondary | ICD-10-CM | POA: Diagnosis not present

## 2019-09-27 DIAGNOSIS — I440 Atrioventricular block, first degree: Secondary | ICD-10-CM | POA: Diagnosis not present

## 2019-09-27 DIAGNOSIS — I130 Hypertensive heart and chronic kidney disease with heart failure and stage 1 through stage 4 chronic kidney disease, or unspecified chronic kidney disease: Secondary | ICD-10-CM | POA: Diagnosis not present

## 2019-09-27 DIAGNOSIS — E785 Hyperlipidemia, unspecified: Secondary | ICD-10-CM | POA: Diagnosis not present

## 2019-09-27 DIAGNOSIS — E860 Dehydration: Secondary | ICD-10-CM | POA: Diagnosis not present

## 2019-09-27 DIAGNOSIS — Z6830 Body mass index (BMI) 30.0-30.9, adult: Secondary | ICD-10-CM | POA: Diagnosis not present

## 2019-09-27 DIAGNOSIS — Z9181 History of falling: Secondary | ICD-10-CM | POA: Diagnosis not present

## 2019-09-28 DIAGNOSIS — N183 Chronic kidney disease, stage 3 unspecified: Secondary | ICD-10-CM | POA: Diagnosis not present

## 2019-09-28 DIAGNOSIS — I5030 Unspecified diastolic (congestive) heart failure: Secondary | ICD-10-CM | POA: Diagnosis not present

## 2019-09-28 DIAGNOSIS — I130 Hypertensive heart and chronic kidney disease with heart failure and stage 1 through stage 4 chronic kidney disease, or unspecified chronic kidney disease: Secondary | ICD-10-CM | POA: Diagnosis not present

## 2019-09-28 DIAGNOSIS — N400 Enlarged prostate without lower urinary tract symptoms: Secondary | ICD-10-CM | POA: Diagnosis not present

## 2019-09-28 DIAGNOSIS — E11319 Type 2 diabetes mellitus with unspecified diabetic retinopathy without macular edema: Secondary | ICD-10-CM | POA: Diagnosis not present

## 2019-09-28 DIAGNOSIS — J449 Chronic obstructive pulmonary disease, unspecified: Secondary | ICD-10-CM | POA: Diagnosis not present

## 2019-09-28 DIAGNOSIS — E1122 Type 2 diabetes mellitus with diabetic chronic kidney disease: Secondary | ICD-10-CM | POA: Diagnosis not present

## 2019-09-28 DIAGNOSIS — M1991 Primary osteoarthritis, unspecified site: Secondary | ICD-10-CM | POA: Diagnosis not present

## 2019-09-28 DIAGNOSIS — E1151 Type 2 diabetes mellitus with diabetic peripheral angiopathy without gangrene: Secondary | ICD-10-CM | POA: Diagnosis not present

## 2019-09-28 DIAGNOSIS — N390 Urinary tract infection, site not specified: Secondary | ICD-10-CM | POA: Diagnosis not present

## 2019-09-28 DIAGNOSIS — J9621 Acute and chronic respiratory failure with hypoxia: Secondary | ICD-10-CM | POA: Diagnosis not present

## 2019-09-29 DIAGNOSIS — E1122 Type 2 diabetes mellitus with diabetic chronic kidney disease: Secondary | ICD-10-CM | POA: Diagnosis not present

## 2019-09-29 DIAGNOSIS — E1151 Type 2 diabetes mellitus with diabetic peripheral angiopathy without gangrene: Secondary | ICD-10-CM | POA: Diagnosis not present

## 2019-09-29 DIAGNOSIS — E11319 Type 2 diabetes mellitus with unspecified diabetic retinopathy without macular edema: Secondary | ICD-10-CM | POA: Diagnosis not present

## 2019-09-29 DIAGNOSIS — J449 Chronic obstructive pulmonary disease, unspecified: Secondary | ICD-10-CM | POA: Diagnosis not present

## 2019-09-29 DIAGNOSIS — J9621 Acute and chronic respiratory failure with hypoxia: Secondary | ICD-10-CM | POA: Diagnosis not present

## 2019-09-29 DIAGNOSIS — M1991 Primary osteoarthritis, unspecified site: Secondary | ICD-10-CM | POA: Diagnosis not present

## 2019-09-29 DIAGNOSIS — N400 Enlarged prostate without lower urinary tract symptoms: Secondary | ICD-10-CM | POA: Diagnosis not present

## 2019-09-29 DIAGNOSIS — I5030 Unspecified diastolic (congestive) heart failure: Secondary | ICD-10-CM | POA: Diagnosis not present

## 2019-09-29 DIAGNOSIS — I130 Hypertensive heart and chronic kidney disease with heart failure and stage 1 through stage 4 chronic kidney disease, or unspecified chronic kidney disease: Secondary | ICD-10-CM | POA: Diagnosis not present

## 2019-09-29 DIAGNOSIS — N183 Chronic kidney disease, stage 3 unspecified: Secondary | ICD-10-CM | POA: Diagnosis not present

## 2019-10-01 DIAGNOSIS — J449 Chronic obstructive pulmonary disease, unspecified: Secondary | ICD-10-CM | POA: Diagnosis not present

## 2019-10-01 DIAGNOSIS — E1122 Type 2 diabetes mellitus with diabetic chronic kidney disease: Secondary | ICD-10-CM | POA: Diagnosis not present

## 2019-10-01 DIAGNOSIS — I130 Hypertensive heart and chronic kidney disease with heart failure and stage 1 through stage 4 chronic kidney disease, or unspecified chronic kidney disease: Secondary | ICD-10-CM | POA: Diagnosis not present

## 2019-10-01 DIAGNOSIS — M1991 Primary osteoarthritis, unspecified site: Secondary | ICD-10-CM | POA: Diagnosis not present

## 2019-10-01 DIAGNOSIS — E1151 Type 2 diabetes mellitus with diabetic peripheral angiopathy without gangrene: Secondary | ICD-10-CM | POA: Diagnosis not present

## 2019-10-01 DIAGNOSIS — E11319 Type 2 diabetes mellitus with unspecified diabetic retinopathy without macular edema: Secondary | ICD-10-CM | POA: Diagnosis not present

## 2019-10-01 DIAGNOSIS — J9621 Acute and chronic respiratory failure with hypoxia: Secondary | ICD-10-CM | POA: Diagnosis not present

## 2019-10-01 DIAGNOSIS — N183 Chronic kidney disease, stage 3 unspecified: Secondary | ICD-10-CM | POA: Diagnosis not present

## 2019-10-01 DIAGNOSIS — I5030 Unspecified diastolic (congestive) heart failure: Secondary | ICD-10-CM | POA: Diagnosis not present

## 2019-10-01 DIAGNOSIS — N400 Enlarged prostate without lower urinary tract symptoms: Secondary | ICD-10-CM | POA: Diagnosis not present

## 2019-10-02 DIAGNOSIS — E860 Dehydration: Secondary | ICD-10-CM | POA: Diagnosis not present

## 2019-10-02 DIAGNOSIS — I719 Aortic aneurysm of unspecified site, without rupture: Secondary | ICD-10-CM | POA: Diagnosis not present

## 2019-10-02 DIAGNOSIS — R531 Weakness: Secondary | ICD-10-CM | POA: Diagnosis not present

## 2019-10-02 DIAGNOSIS — Z7689 Persons encountering health services in other specified circumstances: Secondary | ICD-10-CM | POA: Diagnosis not present

## 2019-10-03 NOTE — Progress Notes (Signed)
Spoke with [facility] where pt was previously seen by Dr. Bridgett Larsson. They will call and make a FU appointment with a provider in the practice since Dr. Bridgett Larsson is no longer there.

## 2019-10-07 ENCOUNTER — Telehealth (HOSPITAL_COMMUNITY): Payer: Self-pay

## 2019-10-07 DIAGNOSIS — M1991 Primary osteoarthritis, unspecified site: Secondary | ICD-10-CM | POA: Diagnosis not present

## 2019-10-07 DIAGNOSIS — N400 Enlarged prostate without lower urinary tract symptoms: Secondary | ICD-10-CM | POA: Diagnosis not present

## 2019-10-07 DIAGNOSIS — J449 Chronic obstructive pulmonary disease, unspecified: Secondary | ICD-10-CM | POA: Diagnosis not present

## 2019-10-07 DIAGNOSIS — E1122 Type 2 diabetes mellitus with diabetic chronic kidney disease: Secondary | ICD-10-CM | POA: Diagnosis not present

## 2019-10-07 DIAGNOSIS — N183 Chronic kidney disease, stage 3 unspecified: Secondary | ICD-10-CM | POA: Diagnosis not present

## 2019-10-07 DIAGNOSIS — I130 Hypertensive heart and chronic kidney disease with heart failure and stage 1 through stage 4 chronic kidney disease, or unspecified chronic kidney disease: Secondary | ICD-10-CM | POA: Diagnosis not present

## 2019-10-07 DIAGNOSIS — E1151 Type 2 diabetes mellitus with diabetic peripheral angiopathy without gangrene: Secondary | ICD-10-CM | POA: Diagnosis not present

## 2019-10-07 DIAGNOSIS — E11319 Type 2 diabetes mellitus with unspecified diabetic retinopathy without macular edema: Secondary | ICD-10-CM | POA: Diagnosis not present

## 2019-10-07 DIAGNOSIS — J9621 Acute and chronic respiratory failure with hypoxia: Secondary | ICD-10-CM | POA: Diagnosis not present

## 2019-10-07 DIAGNOSIS — I5030 Unspecified diastolic (congestive) heart failure: Secondary | ICD-10-CM | POA: Diagnosis not present

## 2019-10-07 NOTE — Telephone Encounter (Signed)

## 2019-10-08 ENCOUNTER — Ambulatory Visit (INDEPENDENT_AMBULATORY_CARE_PROVIDER_SITE_OTHER): Payer: Medicare HMO | Admitting: Vascular Surgery

## 2019-10-08 ENCOUNTER — Encounter: Payer: Self-pay | Admitting: Vascular Surgery

## 2019-10-08 ENCOUNTER — Other Ambulatory Visit: Payer: Self-pay

## 2019-10-08 VITALS — BP 154/76 | HR 57 | Resp 20 | Ht 70.0 in | Wt 217.0 lb

## 2019-10-08 DIAGNOSIS — E1122 Type 2 diabetes mellitus with diabetic chronic kidney disease: Secondary | ICD-10-CM | POA: Diagnosis not present

## 2019-10-08 DIAGNOSIS — N400 Enlarged prostate without lower urinary tract symptoms: Secondary | ICD-10-CM | POA: Diagnosis not present

## 2019-10-08 DIAGNOSIS — I130 Hypertensive heart and chronic kidney disease with heart failure and stage 1 through stage 4 chronic kidney disease, or unspecified chronic kidney disease: Secondary | ICD-10-CM | POA: Diagnosis not present

## 2019-10-08 DIAGNOSIS — I714 Abdominal aortic aneurysm, without rupture, unspecified: Secondary | ICD-10-CM

## 2019-10-08 DIAGNOSIS — E1151 Type 2 diabetes mellitus with diabetic peripheral angiopathy without gangrene: Secondary | ICD-10-CM | POA: Diagnosis not present

## 2019-10-08 DIAGNOSIS — M1991 Primary osteoarthritis, unspecified site: Secondary | ICD-10-CM | POA: Diagnosis not present

## 2019-10-08 DIAGNOSIS — J449 Chronic obstructive pulmonary disease, unspecified: Secondary | ICD-10-CM | POA: Diagnosis not present

## 2019-10-08 DIAGNOSIS — E11319 Type 2 diabetes mellitus with unspecified diabetic retinopathy without macular edema: Secondary | ICD-10-CM | POA: Diagnosis not present

## 2019-10-08 DIAGNOSIS — J9621 Acute and chronic respiratory failure with hypoxia: Secondary | ICD-10-CM | POA: Diagnosis not present

## 2019-10-08 DIAGNOSIS — I5030 Unspecified diastolic (congestive) heart failure: Secondary | ICD-10-CM | POA: Diagnosis not present

## 2019-10-08 DIAGNOSIS — N183 Chronic kidney disease, stage 3 unspecified: Secondary | ICD-10-CM | POA: Diagnosis not present

## 2019-10-08 NOTE — Progress Notes (Signed)
Patient name: Ethan Walters MRN: [id number] DOB: 01-Aug-1933 Sex: male  REASON FOR VISIT: Evaluate 5.8 cm abdominal aortic aneurysm  HPI: Ethan Walters is a 86 y.o. male with history of COPD on 3 L home oxygen, congestive heart failure, diabetes, hypertension, previous tobacco abuse that presents to discuss his abdominal aortic aneurysm.  Patient is a previous patient of our practice and followed by Dr. Bridgett Larsson.  He had a CTA done on 11/28/2017 with evidence of a 5.7 cm AAA after seeing our nurse practitioner.  There was no follow-up after the CT and patient is unclear on what happened after the CT was obtained.  Recently seaw Dr. Geraldo Pitter and had a duplex on 09/24/19 and now 5.8 cm infrarenal AAA and was sent back to vascular surgery for further evaluation.  Patient was initially very hesitant to have any surgery but on further consideration states he is open to hearing his options.  He is not very ambulatory and pretty much sedentary at home.  Walks with a walker.  3L home O2.  No hx of abdominal surgery.  Past Medical History:  Diagnosis Date  . AAA (abdominal aortic aneurysm) ([place])   . Diabetes mellitus without complication ([place])   . Hypertension     Past Surgical History:  Procedure Laterality Date  . APPENDECTOMY  09/17/2015  . HERNIA REPAIR      History reviewed. No pertinent family history.  SOCIAL HISTORY: Social History   Tobacco Use  . Smoking status: Never Smoker  . Smokeless tobacco: Never Used  Substance Use Topics  . Alcohol use: No    Alcohol/week: 0.0 standard drinks    Allergies  Allergen Reactions  . Neosporin [Neomycin-Bacitracin Zn-Polymyx] Swelling  . Other Swelling    Current Outpatient Medications  Medication Sig Dispense Refill  . albuterol (PROAIR HFA) 108 (90 Base) MCG/ACT inhaler     . amLODipine (NORVASC) 10 MG tablet Take 10 mg by mouth daily.     Marland Kitchen aspirin EC 81 MG tablet Take 81 mg by mouth daily.    . bisoprolol (ZEBETA) 10 MG tablet Take by  mouth daily.     . hydrALAZINE (APRESOLINE) 50 MG tablet     . latanoprost (XALATAN) 0.005 % ophthalmic solution Place 1 drop into the right eye at bedtime.    . simvastatin (ZOCOR) 40 MG tablet Take 1 tablet (40 mg total) by mouth at bedtime. 90 tablet 3  . torsemide (DEMADEX) 20 MG tablet Take 20 mg by mouth 2 (two) times daily.    . TRADJENTA 5 MG TABS tablet Take 5 mg by mouth at bedtime.     . clindamycin (CLEOCIN) 300 MG capsule Take 300 mg by mouth 3 (three) times daily.     No current facility-administered medications for this visit.    REVIEW OF SYSTEMS:  [X]  denotes positive finding, [ ]  denotes negative finding Cardiac  Comments:  Chest pain or chest pressure:    Shortness of breath upon exertion:    Short of breath when lying flat:    Irregular heart rhythm:        Vascular    Pain in calf, thigh, or hip brought on by ambulation:    Pain in feet at night that wakes you up from your sleep:     Blood clot in your veins:    Leg swelling:         Pulmonary    Oxygen at home:    Productive cough:  Wheezing:         Neurologic    Sudden weakness in arms or legs:     Sudden numbness in arms or legs:     Sudden onset of difficulty speaking or slurred speech:    Temporary loss of vision in one eye:     Problems with dizziness:         Gastrointestinal    Blood in stool:     Vomited blood:         Genitourinary    Burning when urinating:     Blood in urine:        Psychiatric    Major depression:         Hematologic    Bleeding problems:    Problems with blood clotting too easily:        Skin    Rashes or ulcers:        Constitutional    Fever or chills:      PHYSICAL EXAM: Vitals:   10/08/19 1024  BP: (!) 154/76  Pulse: (!) 57  Resp: 20  SpO2: 98%  Weight: 217 lb (98.4 kg)  Height: 5\' 10"  (1.778 m)    GENERAL: The patient is a well-nourished male, in no acute distress. The vital signs are documented above. CARDIAC: There is a regular rate  and rhythm.  VASCULAR:  Palpable femoral pulses bilaterally PULMONARY: There is good air exchange bilaterally without wheezing or rales. ABDOMEN: Soft and non-tender with normal pitched bowel sounds.  MUSCULOSKELETAL: There are no major deformities or cyanosis. NEUROLOGIC: No focal weakness or paresthesias are detected. SKIN: There are no ulcers or rashes noted.  Lower extremity venous changes PSYCHIATRIC: The patient has a normal affect.  DATA:   Repeat AAA duplex 09/24/2019 by Dr. Geraldo Pitter shows a 5.8 cm abdominal aortic aneurysm.    Assessment/Plan:  86 year old male with COPD on home oxygen, CHF, hypertension diabetes the presents for evaluation of 5.8 cm abdominal aortic aneurysm.  He was previously under the care of Dr. Bridgett Larsson and had a CTA done in 2019 but there was no follow-up after the CT was performed.  Aneurysm was 5.7 cm at that time.  In review of that imaging from several years ago appeared to have a fairly diseased neck as well as diseased iliacs for access for endovascular repair.  Discussed that current guidelines for repair are at > 5.5 cm.  However I do not think he would be a good candidate for any open surgery given his comorbidities and his overall appearance in clinic today which his family completely agrees.  Discussed that we can get an updated CT scan to evaluate his options for endovascular repair given his last of images is several years old.  Will follow-up in clinic after CTA to discuss next steps.  He is somewhat hesistant about any type of repair and may decide no surgery at all understanding at current size he probably has a 5 to 15% risk of rupture/yearly.   Marty Heck, MD [facility] Office: [phone]

## 2019-10-09 ENCOUNTER — Other Ambulatory Visit: Payer: Self-pay

## 2019-10-09 DIAGNOSIS — I713 Abdominal aortic aneurysm, ruptured, unspecified: Secondary | ICD-10-CM

## 2019-10-10 DIAGNOSIS — J9621 Acute and chronic respiratory failure with hypoxia: Secondary | ICD-10-CM | POA: Diagnosis not present

## 2019-10-10 DIAGNOSIS — E1122 Type 2 diabetes mellitus with diabetic chronic kidney disease: Secondary | ICD-10-CM | POA: Diagnosis not present

## 2019-10-10 DIAGNOSIS — N400 Enlarged prostate without lower urinary tract symptoms: Secondary | ICD-10-CM | POA: Diagnosis not present

## 2019-10-10 DIAGNOSIS — N183 Chronic kidney disease, stage 3 unspecified: Secondary | ICD-10-CM | POA: Diagnosis not present

## 2019-10-10 DIAGNOSIS — I5030 Unspecified diastolic (congestive) heart failure: Secondary | ICD-10-CM | POA: Diagnosis not present

## 2019-10-10 DIAGNOSIS — E1151 Type 2 diabetes mellitus with diabetic peripheral angiopathy without gangrene: Secondary | ICD-10-CM | POA: Diagnosis not present

## 2019-10-10 DIAGNOSIS — E11319 Type 2 diabetes mellitus with unspecified diabetic retinopathy without macular edema: Secondary | ICD-10-CM | POA: Diagnosis not present

## 2019-10-10 DIAGNOSIS — J449 Chronic obstructive pulmonary disease, unspecified: Secondary | ICD-10-CM | POA: Diagnosis not present

## 2019-10-10 DIAGNOSIS — M1991 Primary osteoarthritis, unspecified site: Secondary | ICD-10-CM | POA: Diagnosis not present

## 2019-10-10 DIAGNOSIS — I130 Hypertensive heart and chronic kidney disease with heart failure and stage 1 through stage 4 chronic kidney disease, or unspecified chronic kidney disease: Secondary | ICD-10-CM | POA: Diagnosis not present

## 2019-10-14 DIAGNOSIS — E1151 Type 2 diabetes mellitus with diabetic peripheral angiopathy without gangrene: Secondary | ICD-10-CM | POA: Diagnosis not present

## 2019-10-14 DIAGNOSIS — N400 Enlarged prostate without lower urinary tract symptoms: Secondary | ICD-10-CM | POA: Diagnosis not present

## 2019-10-14 DIAGNOSIS — J9621 Acute and chronic respiratory failure with hypoxia: Secondary | ICD-10-CM | POA: Diagnosis not present

## 2019-10-14 DIAGNOSIS — E1122 Type 2 diabetes mellitus with diabetic chronic kidney disease: Secondary | ICD-10-CM | POA: Diagnosis not present

## 2019-10-14 DIAGNOSIS — I130 Hypertensive heart and chronic kidney disease with heart failure and stage 1 through stage 4 chronic kidney disease, or unspecified chronic kidney disease: Secondary | ICD-10-CM | POA: Diagnosis not present

## 2019-10-14 DIAGNOSIS — M1991 Primary osteoarthritis, unspecified site: Secondary | ICD-10-CM | POA: Diagnosis not present

## 2019-10-14 DIAGNOSIS — E11319 Type 2 diabetes mellitus with unspecified diabetic retinopathy without macular edema: Secondary | ICD-10-CM | POA: Diagnosis not present

## 2019-10-14 DIAGNOSIS — N183 Chronic kidney disease, stage 3 unspecified: Secondary | ICD-10-CM | POA: Diagnosis not present

## 2019-10-14 DIAGNOSIS — I5030 Unspecified diastolic (congestive) heart failure: Secondary | ICD-10-CM | POA: Diagnosis not present

## 2019-10-14 DIAGNOSIS — J449 Chronic obstructive pulmonary disease, unspecified: Secondary | ICD-10-CM | POA: Diagnosis not present

## 2019-10-15 DIAGNOSIS — E11319 Type 2 diabetes mellitus with unspecified diabetic retinopathy without macular edema: Secondary | ICD-10-CM | POA: Diagnosis not present

## 2019-10-15 DIAGNOSIS — E1122 Type 2 diabetes mellitus with diabetic chronic kidney disease: Secondary | ICD-10-CM | POA: Diagnosis not present

## 2019-10-15 DIAGNOSIS — I130 Hypertensive heart and chronic kidney disease with heart failure and stage 1 through stage 4 chronic kidney disease, or unspecified chronic kidney disease: Secondary | ICD-10-CM | POA: Diagnosis not present

## 2019-10-15 DIAGNOSIS — N183 Chronic kidney disease, stage 3 unspecified: Secondary | ICD-10-CM | POA: Diagnosis not present

## 2019-10-15 DIAGNOSIS — I5030 Unspecified diastolic (congestive) heart failure: Secondary | ICD-10-CM | POA: Diagnosis not present

## 2019-10-15 DIAGNOSIS — N400 Enlarged prostate without lower urinary tract symptoms: Secondary | ICD-10-CM | POA: Diagnosis not present

## 2019-10-15 DIAGNOSIS — J449 Chronic obstructive pulmonary disease, unspecified: Secondary | ICD-10-CM | POA: Diagnosis not present

## 2019-10-15 DIAGNOSIS — M1991 Primary osteoarthritis, unspecified site: Secondary | ICD-10-CM | POA: Diagnosis not present

## 2019-10-15 DIAGNOSIS — J9621 Acute and chronic respiratory failure with hypoxia: Secondary | ICD-10-CM | POA: Diagnosis not present

## 2019-10-15 DIAGNOSIS — E1151 Type 2 diabetes mellitus with diabetic peripheral angiopathy without gangrene: Secondary | ICD-10-CM | POA: Diagnosis not present

## 2019-10-16 DIAGNOSIS — E11319 Type 2 diabetes mellitus with unspecified diabetic retinopathy without macular edema: Secondary | ICD-10-CM | POA: Diagnosis not present

## 2019-10-16 DIAGNOSIS — E1151 Type 2 diabetes mellitus with diabetic peripheral angiopathy without gangrene: Secondary | ICD-10-CM | POA: Diagnosis not present

## 2019-10-16 DIAGNOSIS — N400 Enlarged prostate without lower urinary tract symptoms: Secondary | ICD-10-CM | POA: Diagnosis not present

## 2019-10-16 DIAGNOSIS — E1122 Type 2 diabetes mellitus with diabetic chronic kidney disease: Secondary | ICD-10-CM | POA: Diagnosis not present

## 2019-10-16 DIAGNOSIS — I130 Hypertensive heart and chronic kidney disease with heart failure and stage 1 through stage 4 chronic kidney disease, or unspecified chronic kidney disease: Secondary | ICD-10-CM | POA: Diagnosis not present

## 2019-10-16 DIAGNOSIS — J449 Chronic obstructive pulmonary disease, unspecified: Secondary | ICD-10-CM | POA: Diagnosis not present

## 2019-10-16 DIAGNOSIS — I5030 Unspecified diastolic (congestive) heart failure: Secondary | ICD-10-CM | POA: Diagnosis not present

## 2019-10-16 DIAGNOSIS — M1991 Primary osteoarthritis, unspecified site: Secondary | ICD-10-CM | POA: Diagnosis not present

## 2019-10-16 DIAGNOSIS — J9621 Acute and chronic respiratory failure with hypoxia: Secondary | ICD-10-CM | POA: Diagnosis not present

## 2019-10-16 DIAGNOSIS — N183 Chronic kidney disease, stage 3 unspecified: Secondary | ICD-10-CM | POA: Diagnosis not present

## 2019-10-19 DIAGNOSIS — J449 Chronic obstructive pulmonary disease, unspecified: Secondary | ICD-10-CM | POA: Diagnosis not present

## 2019-10-22 DIAGNOSIS — J9621 Acute and chronic respiratory failure with hypoxia: Secondary | ICD-10-CM | POA: Diagnosis not present

## 2019-10-22 DIAGNOSIS — E11319 Type 2 diabetes mellitus with unspecified diabetic retinopathy without macular edema: Secondary | ICD-10-CM | POA: Diagnosis not present

## 2019-10-22 DIAGNOSIS — N400 Enlarged prostate without lower urinary tract symptoms: Secondary | ICD-10-CM | POA: Diagnosis not present

## 2019-10-22 DIAGNOSIS — M1991 Primary osteoarthritis, unspecified site: Secondary | ICD-10-CM | POA: Diagnosis not present

## 2019-10-22 DIAGNOSIS — J449 Chronic obstructive pulmonary disease, unspecified: Secondary | ICD-10-CM | POA: Diagnosis not present

## 2019-10-22 DIAGNOSIS — E1122 Type 2 diabetes mellitus with diabetic chronic kidney disease: Secondary | ICD-10-CM | POA: Diagnosis not present

## 2019-10-22 DIAGNOSIS — E1151 Type 2 diabetes mellitus with diabetic peripheral angiopathy without gangrene: Secondary | ICD-10-CM | POA: Diagnosis not present

## 2019-10-22 DIAGNOSIS — I130 Hypertensive heart and chronic kidney disease with heart failure and stage 1 through stage 4 chronic kidney disease, or unspecified chronic kidney disease: Secondary | ICD-10-CM | POA: Diagnosis not present

## 2019-10-22 DIAGNOSIS — I5030 Unspecified diastolic (congestive) heart failure: Secondary | ICD-10-CM | POA: Diagnosis not present

## 2019-10-22 DIAGNOSIS — N183 Chronic kidney disease, stage 3 unspecified: Secondary | ICD-10-CM | POA: Diagnosis not present

## 2019-10-24 ENCOUNTER — Other Ambulatory Visit: Payer: Medicare HMO

## 2019-10-24 DIAGNOSIS — N183 Chronic kidney disease, stage 3 unspecified: Secondary | ICD-10-CM | POA: Diagnosis not present

## 2019-10-24 DIAGNOSIS — N400 Enlarged prostate without lower urinary tract symptoms: Secondary | ICD-10-CM | POA: Diagnosis not present

## 2019-10-24 DIAGNOSIS — E1122 Type 2 diabetes mellitus with diabetic chronic kidney disease: Secondary | ICD-10-CM | POA: Diagnosis not present

## 2019-10-24 DIAGNOSIS — J9621 Acute and chronic respiratory failure with hypoxia: Secondary | ICD-10-CM | POA: Diagnosis not present

## 2019-10-24 DIAGNOSIS — I130 Hypertensive heart and chronic kidney disease with heart failure and stage 1 through stage 4 chronic kidney disease, or unspecified chronic kidney disease: Secondary | ICD-10-CM | POA: Diagnosis not present

## 2019-10-24 DIAGNOSIS — E11319 Type 2 diabetes mellitus with unspecified diabetic retinopathy without macular edema: Secondary | ICD-10-CM | POA: Diagnosis not present

## 2019-10-24 DIAGNOSIS — I5030 Unspecified diastolic (congestive) heart failure: Secondary | ICD-10-CM | POA: Diagnosis not present

## 2019-10-24 DIAGNOSIS — J449 Chronic obstructive pulmonary disease, unspecified: Secondary | ICD-10-CM | POA: Diagnosis not present

## 2019-10-24 DIAGNOSIS — E1151 Type 2 diabetes mellitus with diabetic peripheral angiopathy without gangrene: Secondary | ICD-10-CM | POA: Diagnosis not present

## 2019-10-24 DIAGNOSIS — M1991 Primary osteoarthritis, unspecified site: Secondary | ICD-10-CM | POA: Diagnosis not present

## 2019-10-25 DIAGNOSIS — E1151 Type 2 diabetes mellitus with diabetic peripheral angiopathy without gangrene: Secondary | ICD-10-CM | POA: Diagnosis not present

## 2019-10-25 DIAGNOSIS — N183 Chronic kidney disease, stage 3 unspecified: Secondary | ICD-10-CM | POA: Diagnosis not present

## 2019-10-25 DIAGNOSIS — E11319 Type 2 diabetes mellitus with unspecified diabetic retinopathy without macular edema: Secondary | ICD-10-CM | POA: Diagnosis not present

## 2019-10-25 DIAGNOSIS — E1122 Type 2 diabetes mellitus with diabetic chronic kidney disease: Secondary | ICD-10-CM | POA: Diagnosis not present

## 2019-10-25 DIAGNOSIS — I5030 Unspecified diastolic (congestive) heart failure: Secondary | ICD-10-CM | POA: Diagnosis not present

## 2019-10-25 DIAGNOSIS — N400 Enlarged prostate without lower urinary tract symptoms: Secondary | ICD-10-CM | POA: Diagnosis not present

## 2019-10-25 DIAGNOSIS — J449 Chronic obstructive pulmonary disease, unspecified: Secondary | ICD-10-CM | POA: Diagnosis not present

## 2019-10-25 DIAGNOSIS — J9621 Acute and chronic respiratory failure with hypoxia: Secondary | ICD-10-CM | POA: Diagnosis not present

## 2019-10-25 DIAGNOSIS — I130 Hypertensive heart and chronic kidney disease with heart failure and stage 1 through stage 4 chronic kidney disease, or unspecified chronic kidney disease: Secondary | ICD-10-CM | POA: Diagnosis not present

## 2019-10-25 DIAGNOSIS — M1991 Primary osteoarthritis, unspecified site: Secondary | ICD-10-CM | POA: Diagnosis not present

## 2019-10-28 DIAGNOSIS — E11319 Type 2 diabetes mellitus with unspecified diabetic retinopathy without macular edema: Secondary | ICD-10-CM | POA: Diagnosis not present

## 2019-10-28 DIAGNOSIS — N400 Enlarged prostate without lower urinary tract symptoms: Secondary | ICD-10-CM | POA: Diagnosis not present

## 2019-10-28 DIAGNOSIS — I130 Hypertensive heart and chronic kidney disease with heart failure and stage 1 through stage 4 chronic kidney disease, or unspecified chronic kidney disease: Secondary | ICD-10-CM | POA: Diagnosis not present

## 2019-10-28 DIAGNOSIS — E1122 Type 2 diabetes mellitus with diabetic chronic kidney disease: Secondary | ICD-10-CM | POA: Diagnosis not present

## 2019-10-28 DIAGNOSIS — J9621 Acute and chronic respiratory failure with hypoxia: Secondary | ICD-10-CM | POA: Diagnosis not present

## 2019-10-28 DIAGNOSIS — E1151 Type 2 diabetes mellitus with diabetic peripheral angiopathy without gangrene: Secondary | ICD-10-CM | POA: Diagnosis not present

## 2019-10-28 DIAGNOSIS — N183 Chronic kidney disease, stage 3 unspecified: Secondary | ICD-10-CM | POA: Diagnosis not present

## 2019-10-28 DIAGNOSIS — M1991 Primary osteoarthritis, unspecified site: Secondary | ICD-10-CM | POA: Diagnosis not present

## 2019-10-28 DIAGNOSIS — J449 Chronic obstructive pulmonary disease, unspecified: Secondary | ICD-10-CM | POA: Diagnosis not present

## 2019-10-28 DIAGNOSIS — I5030 Unspecified diastolic (congestive) heart failure: Secondary | ICD-10-CM | POA: Diagnosis not present

## 2019-10-29 ENCOUNTER — Ambulatory Visit: Payer: Medicare HMO | Admitting: Vascular Surgery

## 2019-11-04 DIAGNOSIS — N183 Chronic kidney disease, stage 3 unspecified: Secondary | ICD-10-CM | POA: Diagnosis not present

## 2019-11-04 DIAGNOSIS — I5030 Unspecified diastolic (congestive) heart failure: Secondary | ICD-10-CM | POA: Diagnosis not present

## 2019-11-04 DIAGNOSIS — E1122 Type 2 diabetes mellitus with diabetic chronic kidney disease: Secondary | ICD-10-CM | POA: Diagnosis not present

## 2019-11-04 DIAGNOSIS — N400 Enlarged prostate without lower urinary tract symptoms: Secondary | ICD-10-CM | POA: Diagnosis not present

## 2019-11-04 DIAGNOSIS — I130 Hypertensive heart and chronic kidney disease with heart failure and stage 1 through stage 4 chronic kidney disease, or unspecified chronic kidney disease: Secondary | ICD-10-CM | POA: Diagnosis not present

## 2019-11-04 DIAGNOSIS — J449 Chronic obstructive pulmonary disease, unspecified: Secondary | ICD-10-CM | POA: Diagnosis not present

## 2019-11-04 DIAGNOSIS — E1151 Type 2 diabetes mellitus with diabetic peripheral angiopathy without gangrene: Secondary | ICD-10-CM | POA: Diagnosis not present

## 2019-11-04 DIAGNOSIS — J9621 Acute and chronic respiratory failure with hypoxia: Secondary | ICD-10-CM | POA: Diagnosis not present

## 2019-11-04 DIAGNOSIS — M1991 Primary osteoarthritis, unspecified site: Secondary | ICD-10-CM | POA: Diagnosis not present

## 2019-11-04 DIAGNOSIS — E11319 Type 2 diabetes mellitus with unspecified diabetic retinopathy without macular edema: Secondary | ICD-10-CM | POA: Diagnosis not present

## 2019-11-06 DIAGNOSIS — Z1331 Encounter for screening for depression: Secondary | ICD-10-CM | POA: Diagnosis not present

## 2019-11-06 DIAGNOSIS — E785 Hyperlipidemia, unspecified: Secondary | ICD-10-CM | POA: Diagnosis not present

## 2019-11-06 DIAGNOSIS — J9611 Chronic respiratory failure with hypoxia: Secondary | ICD-10-CM | POA: Diagnosis not present

## 2019-11-06 DIAGNOSIS — I5030 Unspecified diastolic (congestive) heart failure: Secondary | ICD-10-CM | POA: Diagnosis not present

## 2019-11-06 DIAGNOSIS — M109 Gout, unspecified: Secondary | ICD-10-CM | POA: Diagnosis not present

## 2019-11-06 DIAGNOSIS — I1310 Hypertensive heart and chronic kidney disease without heart failure, with stage 1 through stage 4 chronic kidney disease, or unspecified chronic kidney disease: Secondary | ICD-10-CM | POA: Diagnosis not present

## 2019-11-06 DIAGNOSIS — R531 Weakness: Secondary | ICD-10-CM | POA: Diagnosis not present

## 2019-11-06 DIAGNOSIS — I714 Abdominal aortic aneurysm, without rupture: Secondary | ICD-10-CM | POA: Diagnosis not present

## 2019-11-06 DIAGNOSIS — Z9181 History of falling: Secondary | ICD-10-CM | POA: Diagnosis not present

## 2019-11-06 DIAGNOSIS — N182 Chronic kidney disease, stage 2 (mild): Secondary | ICD-10-CM | POA: Diagnosis not present

## 2019-11-06 DIAGNOSIS — E119 Type 2 diabetes mellitus without complications: Secondary | ICD-10-CM | POA: Diagnosis not present

## 2019-11-12 DIAGNOSIS — I5030 Unspecified diastolic (congestive) heart failure: Secondary | ICD-10-CM | POA: Diagnosis not present

## 2019-11-12 DIAGNOSIS — I130 Hypertensive heart and chronic kidney disease with heart failure and stage 1 through stage 4 chronic kidney disease, or unspecified chronic kidney disease: Secondary | ICD-10-CM | POA: Diagnosis not present

## 2019-11-12 DIAGNOSIS — M1991 Primary osteoarthritis, unspecified site: Secondary | ICD-10-CM | POA: Diagnosis not present

## 2019-11-12 DIAGNOSIS — N400 Enlarged prostate without lower urinary tract symptoms: Secondary | ICD-10-CM | POA: Diagnosis not present

## 2019-11-12 DIAGNOSIS — J9621 Acute and chronic respiratory failure with hypoxia: Secondary | ICD-10-CM | POA: Diagnosis not present

## 2019-11-12 DIAGNOSIS — E11319 Type 2 diabetes mellitus with unspecified diabetic retinopathy without macular edema: Secondary | ICD-10-CM | POA: Diagnosis not present

## 2019-11-12 DIAGNOSIS — J449 Chronic obstructive pulmonary disease, unspecified: Secondary | ICD-10-CM | POA: Diagnosis not present

## 2019-11-12 DIAGNOSIS — E1151 Type 2 diabetes mellitus with diabetic peripheral angiopathy without gangrene: Secondary | ICD-10-CM | POA: Diagnosis not present

## 2019-11-12 DIAGNOSIS — N183 Chronic kidney disease, stage 3 unspecified: Secondary | ICD-10-CM | POA: Diagnosis not present

## 2019-11-12 DIAGNOSIS — E1122 Type 2 diabetes mellitus with diabetic chronic kidney disease: Secondary | ICD-10-CM | POA: Diagnosis not present

## 2019-11-19 DIAGNOSIS — M1991 Primary osteoarthritis, unspecified site: Secondary | ICD-10-CM | POA: Diagnosis not present

## 2019-11-19 DIAGNOSIS — J9621 Acute and chronic respiratory failure with hypoxia: Secondary | ICD-10-CM | POA: Diagnosis not present

## 2019-11-19 DIAGNOSIS — N400 Enlarged prostate without lower urinary tract symptoms: Secondary | ICD-10-CM | POA: Diagnosis not present

## 2019-11-19 DIAGNOSIS — E1151 Type 2 diabetes mellitus with diabetic peripheral angiopathy without gangrene: Secondary | ICD-10-CM | POA: Diagnosis not present

## 2019-11-19 DIAGNOSIS — E11319 Type 2 diabetes mellitus with unspecified diabetic retinopathy without macular edema: Secondary | ICD-10-CM | POA: Diagnosis not present

## 2019-11-19 DIAGNOSIS — J449 Chronic obstructive pulmonary disease, unspecified: Secondary | ICD-10-CM | POA: Diagnosis not present

## 2019-11-19 DIAGNOSIS — I5030 Unspecified diastolic (congestive) heart failure: Secondary | ICD-10-CM | POA: Diagnosis not present

## 2019-11-19 DIAGNOSIS — I130 Hypertensive heart and chronic kidney disease with heart failure and stage 1 through stage 4 chronic kidney disease, or unspecified chronic kidney disease: Secondary | ICD-10-CM | POA: Diagnosis not present

## 2019-11-19 DIAGNOSIS — N183 Chronic kidney disease, stage 3 unspecified: Secondary | ICD-10-CM | POA: Diagnosis not present

## 2019-11-19 DIAGNOSIS — E1122 Type 2 diabetes mellitus with diabetic chronic kidney disease: Secondary | ICD-10-CM | POA: Diagnosis not present

## 2019-11-20 DIAGNOSIS — R69 Illness, unspecified: Secondary | ICD-10-CM | POA: Diagnosis not present

## 2019-11-22 DIAGNOSIS — I5030 Unspecified diastolic (congestive) heart failure: Secondary | ICD-10-CM | POA: Diagnosis not present

## 2019-11-22 DIAGNOSIS — M1991 Primary osteoarthritis, unspecified site: Secondary | ICD-10-CM | POA: Diagnosis not present

## 2019-11-22 DIAGNOSIS — N400 Enlarged prostate without lower urinary tract symptoms: Secondary | ICD-10-CM | POA: Diagnosis not present

## 2019-11-22 DIAGNOSIS — E11319 Type 2 diabetes mellitus with unspecified diabetic retinopathy without macular edema: Secondary | ICD-10-CM | POA: Diagnosis not present

## 2019-11-22 DIAGNOSIS — I130 Hypertensive heart and chronic kidney disease with heart failure and stage 1 through stage 4 chronic kidney disease, or unspecified chronic kidney disease: Secondary | ICD-10-CM | POA: Diagnosis not present

## 2019-11-22 DIAGNOSIS — E1122 Type 2 diabetes mellitus with diabetic chronic kidney disease: Secondary | ICD-10-CM | POA: Diagnosis not present

## 2019-11-22 DIAGNOSIS — E1151 Type 2 diabetes mellitus with diabetic peripheral angiopathy without gangrene: Secondary | ICD-10-CM | POA: Diagnosis not present

## 2019-11-22 DIAGNOSIS — J9621 Acute and chronic respiratory failure with hypoxia: Secondary | ICD-10-CM | POA: Diagnosis not present

## 2019-11-22 DIAGNOSIS — J449 Chronic obstructive pulmonary disease, unspecified: Secondary | ICD-10-CM | POA: Diagnosis not present

## 2019-11-22 DIAGNOSIS — N183 Chronic kidney disease, stage 3 unspecified: Secondary | ICD-10-CM | POA: Diagnosis not present

## 2019-11-25 DIAGNOSIS — E1122 Type 2 diabetes mellitus with diabetic chronic kidney disease: Secondary | ICD-10-CM | POA: Diagnosis not present

## 2019-11-25 DIAGNOSIS — J9621 Acute and chronic respiratory failure with hypoxia: Secondary | ICD-10-CM | POA: Diagnosis not present

## 2019-11-25 DIAGNOSIS — J449 Chronic obstructive pulmonary disease, unspecified: Secondary | ICD-10-CM | POA: Diagnosis not present

## 2019-11-25 DIAGNOSIS — E1151 Type 2 diabetes mellitus with diabetic peripheral angiopathy without gangrene: Secondary | ICD-10-CM | POA: Diagnosis not present

## 2019-11-25 DIAGNOSIS — N183 Chronic kidney disease, stage 3 unspecified: Secondary | ICD-10-CM | POA: Diagnosis not present

## 2019-11-25 DIAGNOSIS — I130 Hypertensive heart and chronic kidney disease with heart failure and stage 1 through stage 4 chronic kidney disease, or unspecified chronic kidney disease: Secondary | ICD-10-CM | POA: Diagnosis not present

## 2019-11-25 DIAGNOSIS — M1991 Primary osteoarthritis, unspecified site: Secondary | ICD-10-CM | POA: Diagnosis not present

## 2019-11-25 DIAGNOSIS — E11319 Type 2 diabetes mellitus with unspecified diabetic retinopathy without macular edema: Secondary | ICD-10-CM | POA: Diagnosis not present

## 2019-11-25 DIAGNOSIS — I5030 Unspecified diastolic (congestive) heart failure: Secondary | ICD-10-CM | POA: Diagnosis not present

## 2019-11-25 DIAGNOSIS — N400 Enlarged prostate without lower urinary tract symptoms: Secondary | ICD-10-CM | POA: Diagnosis not present

## 2019-12-18 ENCOUNTER — Encounter: Payer: Self-pay | Admitting: Cardiology

## 2019-12-18 ENCOUNTER — Other Ambulatory Visit: Payer: Self-pay

## 2019-12-18 ENCOUNTER — Ambulatory Visit: Payer: Medicare HMO | Admitting: Cardiology

## 2019-12-18 VITALS — BP 130/60 | HR 54 | Ht 70.0 in | Wt 215.8 lb

## 2019-12-18 DIAGNOSIS — E1122 Type 2 diabetes mellitus with diabetic chronic kidney disease: Secondary | ICD-10-CM | POA: Diagnosis not present

## 2019-12-18 DIAGNOSIS — I714 Abdominal aortic aneurysm, without rupture, unspecified: Secondary | ICD-10-CM

## 2019-12-18 DIAGNOSIS — I129 Hypertensive chronic kidney disease with stage 1 through stage 4 chronic kidney disease, or unspecified chronic kidney disease: Secondary | ICD-10-CM

## 2019-12-18 DIAGNOSIS — I7090 Unspecified atherosclerosis: Secondary | ICD-10-CM

## 2019-12-18 DIAGNOSIS — N183 Chronic kidney disease, stage 3 unspecified: Secondary | ICD-10-CM | POA: Diagnosis not present

## 2019-12-18 NOTE — Progress Notes (Signed)
Cardiology Office Note:    Date:  12/18/2019   ID:  Ethan Walters, DOB 25-Nov-1933, MRN [id number]  PCP:  Renaldo Reel, PA  Cardiologist:  Jenean Lindau, MD   Referring MD: Renaldo Reel, PA    ASSESSMENT:    1. Abdominal aortic aneurysm (AAA) without rupture ([place])   2. Benign hypertension with chronic kidney disease, stage III   3. Atherosclerotic vascular disease    PLAN:    In order of problems listed above:  1. Secondary prevention stressed with the patient.  Importance of compliance with diet medication stressed and he vocalized understanding. 2. Abdominal aortic aneurysm: I reviewed notes of vascular surgeon and discussed with the patient about getting evaluated with a CT scan to understand the vascular anatomy for possible percutaneous intervention and he is willing to consider it and he will talk to his surgeon about it.  His daughter-in-law accompanied him for this visit and she understood the discussion.   3. Essential hypertension: Blood pressure stable and monitored by the patient at home 4. Mixed dyslipidemia and diabetes mellitus: Diet was emphasized.  Lipids followed by primary care physician. 5. Patient will be seen in follow-up appointment in 6 months or earlier if the patient has any concerns.   Medication Adjustments/Labs and Tests Ordered: Current medicines are reviewed at length with the patient today.  Concerns regarding medicines are outlined above.  No orders of the defined types were placed in this encounter.  No orders of the defined types were placed in this encounter.    No chief complaint on file.    History of Present Illness:    Ethan Walters is a 86 y.o. male.  Patient has past medical history of atherosclerotic vascular disease essential hypertension dyslipidemia and diabetes mellitus.  He has a significant abdominal aortic aneurysm followed by vascular surgery.  He denies any chest pain orthopnea or PND.  No abdominal pain.  At the time  of my evaluation, the patient is alert awake oriented and in no distress.  Past Medical History:  Diagnosis Date  . AAA (abdominal aortic aneurysm) ([place])   . Diabetes mellitus without complication ([place])   . Hypertension     Past Surgical History:  Procedure Laterality Date  . APPENDECTOMY  09/17/2015  . HERNIA REPAIR      Current Medications: Current Meds  Medication Sig  . albuterol (PROAIR HFA) 108 (90 Base) MCG/ACT inhaler as needed.   Marland Kitchen amLODipine (NORVASC) 10 MG tablet Take 10 mg by mouth daily.   Marland Kitchen aspirin EC 81 MG tablet Take 81 mg by mouth daily.  . bisoprolol (ZEBETA) 10 MG tablet Take by mouth daily.   . Febuxostat 80 MG TABS Take 1 tablet by mouth daily.  . hydrALAZINE (APRESOLINE) 50 MG tablet 50 mg 3 (three) times daily.   Marland Kitchen latanoprost (XALATAN) 0.005 % ophthalmic solution Place 1 drop into the right eye at bedtime.  . mirtazapine (REMERON) 7.5 MG tablet Take 7.5 mg by mouth at bedtime.  . simvastatin (ZOCOR) 40 MG tablet Take 1 tablet (40 mg total) by mouth at bedtime.  . torsemide (DEMADEX) 20 MG tablet Take 20 mg by mouth 2 (two) times daily.  . TRADJENTA 5 MG TABS tablet Take 5 mg by mouth at bedtime.   Tyler Aas FLEXTOUCH 100 UNIT/ML FlexTouch Pen daily.  . [DISCONTINUED] clindamycin (CLEOCIN) 300 MG capsule Take 300 mg by mouth 3 (three) times daily.     Allergies:   Neosporin [neomycin-bacitracin  zn-polymyx] and Other   Social History   Socioeconomic History  . Marital status: Married    Spouse name: Not on file  . Number of children: Not on file  . Years of education: Not on file  . Highest education level: Not on file  Occupational History  . Not on file  Tobacco Use  . Smoking status: Never Smoker  . Smokeless tobacco: Never Used  Substance and Sexual Activity  . Alcohol use: No    Alcohol/week: 0.0 standard drinks  . Drug use: No  . Sexual activity: Not on file  Other Topics Concern  . Not on file  Social History Narrative  . Not on  file   Social Determinants of Health   Financial Resource Strain:   . Difficulty of Paying Living Expenses:   Food Insecurity:   . Worried About Charity fundraiser in the Last Year:   . Arboriculturist in the Last Year:   Transportation Needs:   . Film/video editor (Medical):   Marland Kitchen Lack of Transportation (Non-Medical):   Physical Activity:   . Days of Exercise per Week:   . Minutes of Exercise per Session:   Stress:   . Feeling of Stress :   Social Connections:   . Frequency of Communication with Friends and Family:   . Frequency of Social Gatherings with Friends and Family:   . Attends Religious Services:   . Active Member of Clubs or Organizations:   . Attends Archivist Meetings:   Marland Kitchen Marital Status:      Family History: The patient's family history is not on file.  ROS:   Please see the history of present illness.    All other systems reviewed and are negative.  EKGs/Labs/Other Studies Reviewed:    The following studies were reviewed today: I discussed my findings with the patient at length   Recent Labs: No results found for requested labs within last 8760 hours.  Recent Lipid Panel    Component Value Date/Time   CHOL 162 12/04/2017 0925   TRIG 151 (H) 12/04/2017 0925   HDL 33 (L) 12/04/2017 0925   CHOLHDL 4.9 12/04/2017 0925   LDLCALC 99 12/04/2017 0925    Physical Exam:    VS:  BP 130/60   Pulse (!) 54   Ht 5\' 10"  (1.778 m)   Wt 215 lb 12.8 oz (97.9 kg)   SpO2 97%   BMI 30.96 kg/m     Wt Readings from Last 3 Encounters:  12/18/19 215 lb 12.8 oz (97.9 kg)  10/08/19 217 lb (98.4 kg)  05/31/19 213 lb 12.8 oz (97 kg)     GEN: Patient is in no acute distress HEENT: Normal NECK: No JVD; No carotid bruits LYMPHATICS: No lymphadenopathy CARDIAC: Hear sounds regular, 2/6 systolic murmur at the apex. RESPIRATORY:  Clear to auscultation without rales, wheezing or rhonchi  ABDOMEN: Soft, non-tender, non-distended MUSCULOSKELETAL:  No  edema; No deformity  SKIN: Warm and dry NEUROLOGIC:  Alert and oriented x 3 PSYCHIATRIC:  Normal affect   Signed, Jenean Lindau, MD  12/18/2019 4:14 PM    [facility]

## 2019-12-18 NOTE — Patient Instructions (Signed)

## 2019-12-19 DIAGNOSIS — J449 Chronic obstructive pulmonary disease, unspecified: Secondary | ICD-10-CM | POA: Diagnosis not present

## 2019-12-30 ENCOUNTER — Other Ambulatory Visit: Payer: Self-pay

## 2019-12-30 DIAGNOSIS — I713 Abdominal aortic aneurysm, ruptured, unspecified: Secondary | ICD-10-CM

## 2020-01-19 DIAGNOSIS — J449 Chronic obstructive pulmonary disease, unspecified: Secondary | ICD-10-CM | POA: Diagnosis not present

## 2020-01-28 DIAGNOSIS — L03115 Cellulitis of right lower limb: Secondary | ICD-10-CM | POA: Diagnosis not present

## 2020-01-28 DIAGNOSIS — S31000A Unspecified open wound of lower back and pelvis without penetration into retroperitoneum, initial encounter: Secondary | ICD-10-CM | POA: Diagnosis not present

## 2020-01-28 DIAGNOSIS — B372 Candidiasis of skin and nail: Secondary | ICD-10-CM | POA: Diagnosis not present

## 2020-01-31 ENCOUNTER — Ambulatory Visit
Admission: RE | Admit: 2020-01-31 | Discharge: 2020-01-31 | Disposition: A | Discharge status: Home / Self Care | Payer: Medicare HMO | Source: Ambulatory Visit | Attending: Vascular Surgery | Admitting: Vascular Surgery

## 2020-01-31 DIAGNOSIS — I713 Abdominal aortic aneurysm, ruptured, unspecified: Secondary | ICD-10-CM

## 2020-01-31 DIAGNOSIS — I700 Atherosclerosis of aorta: Secondary | ICD-10-CM | POA: Diagnosis not present

## 2020-01-31 DIAGNOSIS — I714 Abdominal aortic aneurysm, without rupture: Secondary | ICD-10-CM | POA: Diagnosis not present

## 2020-01-31 DIAGNOSIS — K449 Diaphragmatic hernia without obstruction or gangrene: Secondary | ICD-10-CM | POA: Diagnosis not present

## 2020-01-31 DIAGNOSIS — K5730 Diverticulosis of large intestine without perforation or abscess without bleeding: Secondary | ICD-10-CM | POA: Diagnosis not present

## 2020-01-31 MED ORDER — IOPAMIDOL (ISOVUE-370) INJECTION 76%
60.0000 mL | Freq: Once | INTRAVENOUS | Status: AC | PRN
  Administered 2020-01-31: 60 mL via INTRAVENOUS

## 2020-02-03 DIAGNOSIS — J9611 Chronic respiratory failure with hypoxia: Secondary | ICD-10-CM | POA: Diagnosis not present

## 2020-02-03 DIAGNOSIS — L97809 Non-pressure chronic ulcer of other part of unspecified lower leg with unspecified severity: Secondary | ICD-10-CM | POA: Diagnosis not present

## 2020-02-03 DIAGNOSIS — R600 Localized edema: Secondary | ICD-10-CM | POA: Diagnosis not present

## 2020-02-03 DIAGNOSIS — I5030 Unspecified diastolic (congestive) heart failure: Secondary | ICD-10-CM | POA: Diagnosis not present

## 2020-02-03 DIAGNOSIS — L600 Ingrowing nail: Secondary | ICD-10-CM | POA: Diagnosis not present

## 2020-02-03 DIAGNOSIS — I714 Abdominal aortic aneurysm, without rupture: Secondary | ICD-10-CM | POA: Diagnosis not present

## 2020-02-03 DIAGNOSIS — E11622 Type 2 diabetes mellitus with other skin ulcer: Secondary | ICD-10-CM | POA: Diagnosis not present

## 2020-02-03 DIAGNOSIS — Z6830 Body mass index (BMI) 30.0-30.9, adult: Secondary | ICD-10-CM | POA: Diagnosis not present

## 2020-02-03 DIAGNOSIS — R5381 Other malaise: Secondary | ICD-10-CM | POA: Diagnosis not present

## 2020-02-03 DIAGNOSIS — R609 Edema, unspecified: Secondary | ICD-10-CM | POA: Diagnosis not present

## 2020-02-04 ENCOUNTER — Ambulatory Visit (INDEPENDENT_AMBULATORY_CARE_PROVIDER_SITE_OTHER): Payer: Medicare HMO | Admitting: Vascular Surgery

## 2020-02-04 ENCOUNTER — Other Ambulatory Visit: Payer: Self-pay

## 2020-02-04 ENCOUNTER — Encounter: Payer: Self-pay | Admitting: Vascular Surgery

## 2020-02-04 VITALS — BP 157/74 | HR 53 | Temp 97.3°F | Resp 18 | Ht 70.0 in | Wt 214.0 lb

## 2020-02-04 DIAGNOSIS — I714 Abdominal aortic aneurysm, without rupture, unspecified: Secondary | ICD-10-CM

## 2020-02-04 NOTE — Progress Notes (Signed)
Patient name: Ethan Walters MRN: [id number] DOB: 08/24/1933 Sex: male  REASON FOR VISIT: Follow-up after CTA for 5.8 cm abdominal aortic aneurysm  HPI: Ethan Walters is a 86 y.o. male with history of COPD on 3 L home oxygen, congestive heart failure, diabetes, hypertension, previous tobacco abuse that presents to discuss his abdominal aortic aneurysm after recent CTA.  As previously noted, patient was a patient of our practice and followed by Dr. Bridgett Larsson.  He had a CTA done on 11/28/2017 with evidence of a 5.7 cm AAA after seeing our nurse practitioner.  There was no follow-up after the CT and patient is unclear on what happened after the CT was obtained.  Recently seaw Dr. Geraldo Pitter and had a duplex on 09/24/19 and now 5.8 cm infrarenal AAA and was sent back to vascular surgery for further evaluation.  Patient was initially very hesitant to have any surgery but on further consideration states he was open to hearing his options.  He is not very ambulatory and pretty much sedentary at home.  Walks with a walker.  3L home O2.    On follow-up today reports no new abdominal or back pain.  His daughter-in-law states he has been diagnosed with kidney disease in the interim.  Patient still very hesitant about having surgery and unsure he wants to proceed.  Past Medical History:  Diagnosis Date  . AAA (abdominal aortic aneurysm) ([place])   . Diabetes mellitus without complication ([place])   . Hypertension     Past Surgical History:  Procedure Laterality Date  . APPENDECTOMY  09/17/2015  . HERNIA REPAIR      History reviewed. No pertinent family history.  SOCIAL HISTORY: Social History   Tobacco Use  . Smoking status: Never Smoker  . Smokeless tobacco: Never Used  Substance Use Topics  . Alcohol use: No    Alcohol/week: 0.0 standard drinks    Allergies  Allergen Reactions  . Neosporin [Neomycin-Bacitracin Zn-Polymyx] Swelling  . Other Swelling    Current Outpatient Medications  Medication  Sig Dispense Refill  . albuterol (PROAIR HFA) 108 (90 Base) MCG/ACT inhaler as needed.     Marland Kitchen amLODipine (NORVASC) 10 MG tablet Take 10 mg by mouth daily.     Marland Kitchen aspirin EC 81 MG tablet Take 81 mg by mouth daily.    . bisoprolol (ZEBETA) 10 MG tablet Take by mouth daily.     . Febuxostat 80 MG TABS Take 1 tablet by mouth daily.    . hydrALAZINE (APRESOLINE) 50 MG tablet 50 mg 3 (three) times daily.     Marland Kitchen latanoprost (XALATAN) 0.005 % ophthalmic solution Place 1 drop into the right eye at bedtime.    . mirtazapine (REMERON) 7.5 MG tablet Take 7.5 mg by mouth at bedtime.    . simvastatin (ZOCOR) 40 MG tablet Take 1 tablet (40 mg total) by mouth at bedtime. 90 tablet 3  . torsemide (DEMADEX) 20 MG tablet Take 20 mg by mouth 2 (two) times daily.    . TRADJENTA 5 MG TABS tablet Take 5 mg by mouth at bedtime.     Tyler Aas FLEXTOUCH 100 UNIT/ML FlexTouch Pen daily.     No current facility-administered medications for this visit.    REVIEW OF SYSTEMS:  [X]  denotes positive finding, [ ]  denotes negative finding Cardiac  Comments:  Chest pain or chest pressure:    Shortness of breath upon exertion:    Short of breath when lying flat:    Irregular heart  rhythm:        Vascular    Pain in calf, thigh, or hip brought on by ambulation:    Pain in feet at night that wakes you up from your sleep:     Blood clot in your veins:    Leg swelling:         Pulmonary    Oxygen at home:    Productive cough:     Wheezing:         Neurologic    Sudden weakness in arms or legs:     Sudden numbness in arms or legs:     Sudden onset of difficulty speaking or slurred speech:    Temporary loss of vision in one eye:     Problems with dizziness:         Gastrointestinal    Blood in stool:     Vomited blood:         Genitourinary    Burning when urinating:     Blood in urine:        Psychiatric    Major depression:         Hematologic    Bleeding problems:    Problems with blood clotting too  easily:        Skin    Rashes or ulcers:        Constitutional    Fever or chills:      PHYSICAL EXAM: Vitals:   02/04/20 1050  BP: (!) 157/74  Pulse: (!) 53  Resp: 18  Temp: (!) 97.3 F (36.3 C)  TempSrc: Temporal  SpO2: 96%  Weight: 214 lb (97.1 kg)  Height: 5\' 10"  (1.778 m)    GENERAL: The patient is a well-nourished male, in no acute distress. The vital signs are documented above. CARDIAC: There is a regular rate and rhythm.  VASCULAR:  Palpable femoral pulses bilaterally PULMONARY: There is good air exchange bilaterally without wheezing or rales.  3L [place]. ABDOMEN: Soft and non-tender with normal pitched bowel sounds.  MUSCULOSKELETAL: There are no major deformities or cyanosis. NEUROLOGIC: No focal weakness or paresthesias are detected. SKIN: There are no ulcers or rashes noted.  Lower extremity venous changes PSYCHIATRIC: The patient has a normal affect.  DATA:   Repeat AAA duplex 09/24/2019 by Dr. Geraldo Pitter shows a 5.8 cm abdominal aortic aneurysm.  CTA abdomen pelvis 01/13/2020: On my review measures approximately 5.8 cm based on saggital/coronal views (radiologist measured at 6.2 cm).  Has a reverse taper in the neck that is moderately diseased as well as a narrowed segment in the midportion of the aneurysm and severe iliac disease bilaterally. Left common iliac artery 1.7 cm, right common iliac artery 1.6 cm.  Assessment/Plan:  86 year old male with COPD on home oxygen, CHF, hypertension diabetes that presents to discuss 5.8 cm abdominal aortic aneurysm.  He was previously under the care of Dr. Bridgett Larsson and had a CTA done in 2019 but there was no follow-up after the CT was performed.  Aneurysm was 5.7 cm at that time.  I ultimately ordered an updated CTA given the last CT was 86 years old.  I discussed that after review of his CTA he has multiple complicating features including a reverse taper in the neck proximally that may limit seal of the endograft, he also has an area  in the mid aneurysm where there is a narrowing that may affect the gate from opening and also he has moderate to severe iliac disease that may limit acess.  I reviewed all this with him and his daughter in law.  Discussed I was going to review him images with our [facility] device representative to further evaluate options and will contact the patient.  He is going to further discuss with his sons and daughter-in-law's about whether he is interested in pursuing surgery given he has been very hesitant in the past.  I continue to quote him a  5 - 15% yearly rupture risk in the next year and discussed he would not survive this.  I will contact him after further review of CT.  Marty Heck, MD [facility] Office: [phone]

## 2020-02-13 DIAGNOSIS — R69 Illness, unspecified: Secondary | ICD-10-CM | POA: Diagnosis not present

## 2020-02-18 ENCOUNTER — Encounter: Payer: Self-pay | Admitting: Vascular Surgery

## 2020-02-18 ENCOUNTER — Ambulatory Visit (INDEPENDENT_AMBULATORY_CARE_PROVIDER_SITE_OTHER): Payer: Medicare HMO | Admitting: Vascular Surgery

## 2020-02-18 ENCOUNTER — Other Ambulatory Visit: Payer: Self-pay

## 2020-02-18 DIAGNOSIS — I714 Abdominal aortic aneurysm, without rupture, unspecified: Secondary | ICD-10-CM

## 2020-02-18 NOTE — Progress Notes (Signed)
Virtual Visit via Telephone Note    I connected with Ethan Walters on 02/18/2020 using the [email or web address] by telephone and verified that I was speaking with the correct person using two identifiers.    The limitations of evaluation and management by telemedicine and the availability of in person appointments have been previously discussed with the patient and are documented in the patients chart. The patient expressed understanding and consented to proceed.  PCP: Renaldo Reel, PA   Chief Complaint: Phone visit to discuss options for repair of abdominal aortic aneurysm  History of Present Illness: Ethan Walters is a 86 y.o. male with history of COPD on 3 L home oxygen, congestive heart failure, diabetes, hypertension, previous tobacco abuse that presents to further discuss options for repair of his abdominal aortic aneurysm.    As previously noted, he was a patient of our practice and followed by Dr. Bridgett Larsson.  He had a CTA done on 11/28/2017 with evidence of a 5.7 cm AAA after seeing our nurse practitioner.  There was no follow-up after the CT and patient is unclear on what happened after the CT was obtained.  Recently seaw Dr. Geraldo Pitter and had a duplex on 09/24/19 and now 5.8 cm infrarenal AAA and was sent back to vascular surgery for further evaluation.  Patient was initially very hesitant to have any surgery but on further consideration states he was open to hearing his options.  He is not very ambulatory and pretty much sedentary at home.  Walks with a walker.  3L home O2.    Ultimately he had his repeat CTA and after initial review of the imaging I had multiple concerns about stent graft repair and he is really not a good candidate for open surggery.  I wanted to review his CTA with some of the vendors for the stent graft given my concerns.  Past Medical History:  Diagnosis Date  . AAA (abdominal aortic aneurysm) ([place])   . Diabetes mellitus without complication ([place])   . Hypertension      Past Surgical History:  Procedure Laterality Date  . APPENDECTOMY  09/17/2015  . HERNIA REPAIR      Current Meds  Medication Sig  . albuterol (PROAIR HFA) 108 (90 Base) MCG/ACT inhaler as needed.   Marland Kitchen amLODipine (NORVASC) 10 MG tablet Take 10 mg by mouth daily.   Marland Kitchen aspirin EC 81 MG tablet Take 81 mg by mouth daily.  . bisoprolol (ZEBETA) 10 MG tablet Take by mouth daily.   . Febuxostat 80 MG TABS Take 1 tablet by mouth daily.  . hydrALAZINE (APRESOLINE) 50 MG tablet 50 mg 3 (three) times daily.   Marland Kitchen latanoprost (XALATAN) 0.005 % ophthalmic solution Place 1 drop into the right eye at bedtime.  . mirtazapine (REMERON) 7.5 MG tablet Take 7.5 mg by mouth at bedtime.  . simvastatin (ZOCOR) 40 MG tablet Take 1 tablet (40 mg total) by mouth at bedtime.  . torsemide (DEMADEX) 20 MG tablet Take 20 mg by mouth 2 (two) times daily.  . TRADJENTA 5 MG TABS tablet Take 5 mg by mouth at bedtime.   Tyler Aas FLEXTOUCH 100 UNIT/ML FlexTouch Pen daily.    12 system ROS was negative unless otherwise noted in HPI   Observations/Objective:   Assessment and Plan:  86 year old male with multiple comorbidities the presents for phone follow-up to discuss repair of 5.8 cm abdominal aortic aneurysm.  Discussed with him and his daughter that I have mulitple concerns regarding why  he is not a good candidate for stent graft repair particularly given him having a reversed taper short neck in the aneurysm (<11-12 mm), a second pinched area in the main body of the aneurysm that may prevent the gate from opening, and diseased iliac arteries for access.  Discussed all these factors may make stent graft unsuccessful.  Discussed that ideally he would be a open aneurysm repair but given his age of 86 with severe COPD on home oxygen and very poor mobility, I do not think he would do well with open aneurysm repair it would be high risk for major complication and likely discharge to a SNF.  Ultimately he was very hesitant  for repair at the time of our initial consultation when I first met him.  In discussing options with him and his daughter today, he has ultimately elected not to have his aneurysm repaired.  He understands that he is at a 5 to 15% rupture risk in the next year.  Discussed that I would not recommend any emergent repair if he were to present to the ED ruptured.  Follow Up Instructions:   Follow up: I offered follow-up and he would prefer to call if questions or concerns develop   I discussed the assessment and treatment plan with the patient. The patient was provided an opportunity to ask questions and all were answered. The patient agreed with the plan and demonstrated an understanding of the instructions.   The patient was advised to call back or seek an in-person evaluation if the symptoms worsen or if the condition fails to improve as anticipated.  I spent 15 minutes with the patient via telephone encounter.   Signed, Marty Heck [facility] Office: [phone]  02/18/2020, 4:32 PM

## 2020-02-19 DIAGNOSIS — J449 Chronic obstructive pulmonary disease, unspecified: Secondary | ICD-10-CM | POA: Diagnosis not present

## 2020-02-20 ENCOUNTER — Ambulatory Visit: Payer: Medicare HMO | Admitting: Podiatry

## 2020-02-24 DIAGNOSIS — R21 Rash and other nonspecific skin eruption: Secondary | ICD-10-CM | POA: Diagnosis not present

## 2020-02-24 DIAGNOSIS — R600 Localized edema: Secondary | ICD-10-CM | POA: Diagnosis not present

## 2020-02-24 DIAGNOSIS — S81802A Unspecified open wound, left lower leg, initial encounter: Secondary | ICD-10-CM | POA: Diagnosis not present

## 2020-02-24 DIAGNOSIS — S81801A Unspecified open wound, right lower leg, initial encounter: Secondary | ICD-10-CM | POA: Diagnosis not present

## 2020-02-27 DIAGNOSIS — I714 Abdominal aortic aneurysm, without rupture: Secondary | ICD-10-CM | POA: Diagnosis not present

## 2020-02-27 DIAGNOSIS — Z6831 Body mass index (BMI) 31.0-31.9, adult: Secondary | ICD-10-CM | POA: Diagnosis not present

## 2020-02-27 DIAGNOSIS — R4701 Aphasia: Secondary | ICD-10-CM | POA: Diagnosis not present

## 2020-02-27 DIAGNOSIS — N183 Chronic kidney disease, stage 3 unspecified: Secondary | ICD-10-CM | POA: Diagnosis not present

## 2020-02-27 DIAGNOSIS — R609 Edema, unspecified: Secondary | ICD-10-CM | POA: Diagnosis not present

## 2020-02-27 DIAGNOSIS — E119 Type 2 diabetes mellitus without complications: Secondary | ICD-10-CM | POA: Diagnosis not present

## 2020-02-27 DIAGNOSIS — R5381 Other malaise: Secondary | ICD-10-CM | POA: Diagnosis not present

## 2020-02-27 DIAGNOSIS — L0390 Cellulitis, unspecified: Secondary | ICD-10-CM | POA: Diagnosis not present

## 2020-02-27 DIAGNOSIS — J449 Chronic obstructive pulmonary disease, unspecified: Secondary | ICD-10-CM | POA: Diagnosis not present

## 2020-02-28 DIAGNOSIS — I959 Hypotension, unspecified: Secondary | ICD-10-CM | POA: Diagnosis not present

## 2020-02-28 DIAGNOSIS — R079 Chest pain, unspecified: Secondary | ICD-10-CM | POA: Diagnosis not present

## 2020-02-28 DIAGNOSIS — I130 Hypertensive heart and chronic kidney disease with heart failure and stage 1 through stage 4 chronic kidney disease, or unspecified chronic kidney disease: Secondary | ICD-10-CM | POA: Diagnosis not present

## 2020-02-28 DIAGNOSIS — I129 Hypertensive chronic kidney disease with stage 1 through stage 4 chronic kidney disease, or unspecified chronic kidney disease: Secondary | ICD-10-CM | POA: Diagnosis not present

## 2020-02-28 DIAGNOSIS — M549 Dorsalgia, unspecified: Secondary | ICD-10-CM | POA: Diagnosis not present

## 2020-02-28 DIAGNOSIS — N170 Acute kidney failure with tubular necrosis: Secondary | ICD-10-CM | POA: Diagnosis not present

## 2020-02-28 DIAGNOSIS — N183 Chronic kidney disease, stage 3 unspecified: Secondary | ICD-10-CM | POA: Diagnosis not present

## 2020-02-28 DIAGNOSIS — L0390 Cellulitis, unspecified: Secondary | ICD-10-CM | POA: Diagnosis not present

## 2020-02-28 DIAGNOSIS — E1122 Type 2 diabetes mellitus with diabetic chronic kidney disease: Secondary | ICD-10-CM | POA: Diagnosis not present

## 2020-02-28 DIAGNOSIS — S22080A Wedge compression fracture of T11-T12 vertebra, initial encounter for closed fracture: Secondary | ICD-10-CM | POA: Diagnosis not present

## 2020-02-28 DIAGNOSIS — N189 Chronic kidney disease, unspecified: Secondary | ICD-10-CM | POA: Diagnosis not present

## 2020-02-28 DIAGNOSIS — I6782 Cerebral ischemia: Secondary | ICD-10-CM | POA: Diagnosis not present

## 2020-02-28 DIAGNOSIS — R0781 Pleurodynia: Secondary | ICD-10-CM | POA: Diagnosis not present

## 2020-02-28 DIAGNOSIS — I714 Abdominal aortic aneurysm, without rupture: Secondary | ICD-10-CM | POA: Diagnosis not present

## 2020-02-28 DIAGNOSIS — I7090 Unspecified atherosclerosis: Secondary | ICD-10-CM | POA: Diagnosis not present

## 2020-02-28 DIAGNOSIS — I672 Cerebral atherosclerosis: Secondary | ICD-10-CM | POA: Diagnosis not present

## 2020-02-28 DIAGNOSIS — N179 Acute kidney failure, unspecified: Secondary | ICD-10-CM | POA: Diagnosis not present

## 2020-02-28 DIAGNOSIS — H748X2 Other specified disorders of left middle ear and mastoid: Secondary | ICD-10-CM | POA: Diagnosis not present

## 2020-02-28 DIAGNOSIS — I700 Atherosclerosis of aorta: Secondary | ICD-10-CM | POA: Diagnosis not present

## 2020-02-28 DIAGNOSIS — I509 Heart failure, unspecified: Secondary | ICD-10-CM | POA: Diagnosis not present

## 2020-02-28 DIAGNOSIS — J984 Other disorders of lung: Secondary | ICD-10-CM | POA: Diagnosis not present

## 2020-02-28 DIAGNOSIS — J90 Pleural effusion, not elsewhere classified: Secondary | ICD-10-CM | POA: Diagnosis not present

## 2020-02-28 DIAGNOSIS — J449 Chronic obstructive pulmonary disease, unspecified: Secondary | ICD-10-CM | POA: Diagnosis not present

## 2020-02-28 DIAGNOSIS — K5730 Diverticulosis of large intestine without perforation or abscess without bleeding: Secondary | ICD-10-CM | POA: Diagnosis not present

## 2020-02-28 DIAGNOSIS — R0789 Other chest pain: Secondary | ICD-10-CM | POA: Diagnosis not present

## 2020-02-28 DIAGNOSIS — K4090 Unilateral inguinal hernia, without obstruction or gangrene, not specified as recurrent: Secondary | ICD-10-CM | POA: Diagnosis not present

## 2020-02-28 DIAGNOSIS — R4182 Altered mental status, unspecified: Secondary | ICD-10-CM | POA: Diagnosis not present

## 2020-02-29 DIAGNOSIS — R0781 Pleurodynia: Secondary | ICD-10-CM | POA: Diagnosis not present

## 2020-02-29 DIAGNOSIS — I130 Hypertensive heart and chronic kidney disease with heart failure and stage 1 through stage 4 chronic kidney disease, or unspecified chronic kidney disease: Secondary | ICD-10-CM | POA: Diagnosis not present

## 2020-02-29 DIAGNOSIS — L0390 Cellulitis, unspecified: Secondary | ICD-10-CM | POA: Diagnosis not present

## 2020-03-01 DIAGNOSIS — I130 Hypertensive heart and chronic kidney disease with heart failure and stage 1 through stage 4 chronic kidney disease, or unspecified chronic kidney disease: Secondary | ICD-10-CM | POA: Diagnosis not present

## 2020-03-01 DIAGNOSIS — L0390 Cellulitis, unspecified: Secondary | ICD-10-CM | POA: Diagnosis not present

## 2020-03-01 DIAGNOSIS — R0781 Pleurodynia: Secondary | ICD-10-CM | POA: Diagnosis not present

## 2020-03-02 ENCOUNTER — Ambulatory Visit (INDEPENDENT_AMBULATORY_CARE_PROVIDER_SITE_OTHER): Payer: Medicare HMO | Admitting: Podiatry

## 2020-03-02 DIAGNOSIS — Z5329 Procedure and treatment not carried out because of patient's decision for other reasons: Secondary | ICD-10-CM

## 2020-03-02 NOTE — Progress Notes (Signed)
No show for appt. 

## 2020-06-13 DEATH — deceased
